# Patient Record
Sex: Female | Born: 1995 | Race: White | Hispanic: Yes | Marital: Single | Smoking: Never smoker
Health system: Southern US, Community
[De-identification: ages and names within clinical notes are randomized; demographics above are authoritative.]

## PROBLEM LIST (undated history)

## (undated) ENCOUNTER — Inpatient Hospital Stay (HOSPITAL_COMMUNITY): Payer: Self-pay

## (undated) DIAGNOSIS — Z789 Other specified health status: Secondary | ICD-10-CM

## (undated) DIAGNOSIS — F419 Anxiety disorder, unspecified: Secondary | ICD-10-CM

## (undated) DIAGNOSIS — F32A Depression, unspecified: Secondary | ICD-10-CM

## (undated) HISTORY — PX: NO PAST SURGERIES: SHX2092

## (undated) HISTORY — DX: Depression, unspecified: F32.A

## (undated) HISTORY — DX: Anxiety disorder, unspecified: F41.9

---

## 2017-07-04 ENCOUNTER — Encounter (HOSPITAL_COMMUNITY): Payer: Self-pay

## 2017-07-04 ENCOUNTER — Emergency Department (HOSPITAL_COMMUNITY)
Admission: EM | Admit: 2017-07-04 | Discharge: 2017-07-04 | Disposition: A | Discharge status: Home / Self Care | Payer: No Typology Code available for payment source | Attending: Emergency Medicine | Admitting: Emergency Medicine

## 2017-07-04 ENCOUNTER — Emergency Department (HOSPITAL_COMMUNITY): Payer: No Typology Code available for payment source

## 2017-07-04 ENCOUNTER — Other Ambulatory Visit: Payer: Self-pay

## 2017-07-04 DIAGNOSIS — S199XXA Unspecified injury of neck, initial encounter: Secondary | ICD-10-CM | POA: Diagnosis present

## 2017-07-04 DIAGNOSIS — Y92410 Unspecified street and highway as the place of occurrence of the external cause: Secondary | ICD-10-CM | POA: Diagnosis not present

## 2017-07-04 DIAGNOSIS — Y999 Unspecified external cause status: Secondary | ICD-10-CM | POA: Insufficient documentation

## 2017-07-04 DIAGNOSIS — S239XXA Sprain of unspecified parts of thorax, initial encounter: Secondary | ICD-10-CM | POA: Insufficient documentation

## 2017-07-04 DIAGNOSIS — S060X1A Concussion with loss of consciousness of 30 minutes or less, initial encounter: Secondary | ICD-10-CM | POA: Diagnosis not present

## 2017-07-04 DIAGNOSIS — Y9389 Activity, other specified: Secondary | ICD-10-CM | POA: Diagnosis not present

## 2017-07-04 DIAGNOSIS — S161XXA Strain of muscle, fascia and tendon at neck level, initial encounter: Secondary | ICD-10-CM

## 2017-07-04 MED ORDER — CYCLOBENZAPRINE HCL 5 MG PO TABS: 5.0000 mg | ORAL_TABLET | Freq: Two times a day (BID) | ORAL | 0 refills | Status: DC | PRN

## 2017-07-04 MED ORDER — DICLOFENAC SODIUM 50 MG PO TBEC: 50.0000 mg | DELAYED_RELEASE_TABLET | Freq: Two times a day (BID) | ORAL | 0 refills | Status: DC
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
## 2017-07-04 NOTE — Discharge Instructions (Signed)
The muscle relaxant will make you sleepy. Do not drive while taking it. Return to the ED if your symptoms worsen.

## 2017-07-18 ENCOUNTER — Ambulatory Visit: Payer: Self-pay | Admitting: Family Medicine

## 2018-04-13 ENCOUNTER — Emergency Department (HOSPITAL_COMMUNITY)
Admission: EM | Admit: 2018-04-13 | Discharge: 2018-04-14 | Disposition: A | Discharge status: Home / Self Care | Payer: Self-pay | Attending: Emergency Medicine | Admitting: Emergency Medicine

## 2018-04-13 ENCOUNTER — Other Ambulatory Visit: Payer: Self-pay

## 2018-04-13 ENCOUNTER — Encounter (HOSPITAL_COMMUNITY): Payer: Self-pay | Admitting: Emergency Medicine

## 2018-04-13 DIAGNOSIS — S01112A Laceration without foreign body of left eyelid and periocular area, initial encounter: Secondary | ICD-10-CM | POA: Insufficient documentation

## 2018-04-13 DIAGNOSIS — Z79899 Other long term (current) drug therapy: Secondary | ICD-10-CM | POA: Insufficient documentation

## 2018-04-13 DIAGNOSIS — Y9389 Activity, other specified: Secondary | ICD-10-CM | POA: Insufficient documentation

## 2018-04-13 DIAGNOSIS — Z23 Encounter for immunization: Secondary | ICD-10-CM | POA: Insufficient documentation

## 2018-04-13 DIAGNOSIS — S0181XA Laceration without foreign body of other part of head, initial encounter: Secondary | ICD-10-CM

## 2018-04-13 DIAGNOSIS — W208XXA Other cause of strike by thrown, projected or falling object, initial encounter: Secondary | ICD-10-CM | POA: Insufficient documentation

## 2018-04-13 DIAGNOSIS — Y999 Unspecified external cause status: Secondary | ICD-10-CM | POA: Insufficient documentation

## 2018-04-13 DIAGNOSIS — Y929 Unspecified place or not applicable: Secondary | ICD-10-CM | POA: Insufficient documentation

## 2018-04-13 NOTE — ED Triage Notes (Signed)
Pt reports a sharp wood corner of a mirror falling on her tonight and slicing her left eyebrow, laceration about an inch. Bleeding controlled. No other injuries noted. Pain 6/10.

## 2018-04-14 MED ORDER — TETANUS-DIPHTH-ACELL PERTUSSIS 5-2.5-18.5 LF-MCG/0.5 IM SUSP
0.5000 mL | Freq: Once | INTRAMUSCULAR | Status: AC
  Administered 2018-04-14: 0.5 mL via INTRAMUSCULAR
  Filled 2018-04-14: qty 0.5

## 2018-04-14 MED ORDER — IBUPROFEN 800 MG PO TABS
800.0000 mg | ORAL_TABLET | Freq: Once | ORAL | Status: AC
  Administered 2018-04-14: 800 mg via ORAL
  Filled 2018-04-14: qty 1

## 2018-04-14 MED ORDER — LIDOCAINE-EPINEPHRINE (PF) 2 %-1:200000 IJ SOLN
10.0000 mL | Freq: Once | INTRAMUSCULAR | Status: AC
  Administered 2018-04-14: 10 mL
  Filled 2018-04-14: qty 20

## 2018-04-14 NOTE — ED Provider Notes (Signed)
[facility] EMERGENCY DEPARTMENT Provider Note   CSN: [id number] Arrival date & time: 04/13/18  2331     History   Chief Complaint Chief Complaint  Patient presents with  . Facial Laceration    HPI Katrina May is a 22 y.o. female.   22 year old female presents to the emergency department for evaluation of a laceration to her lateral left eyebrow.  This was sustained at approximately 2200 tonight.  She was cleaning when a mirror fell towards her.  She was struck with the sharp wooden corner of the mirror sustaining her injury.  No medication taken prior to arrival.  Complains of a mild headache.  No associated LOC, nausea, vomiting.  The history is provided by the patient. No language interpreter was used.  Laceration   The incident occurred 3 to 5 hours ago. The laceration is located on the face. The laceration is 2 cm in size. Injury mechanism: wooden edge of mirror. The pain is mild. The pain has been constant since onset. She reports no foreign bodies present. Her tetanus status is unknown.    History reviewed. No pertinent past medical history.  There are no active problems to display for this patient.   History reviewed. No pertinent surgical history.   OB History   None      Home Medications    Prior to Admission medications   Medication Sig Start Date End Date Taking? Authorizing Provider  cyclobenzaprine (FLEXERIL) 5 MG tablet Take 1 tablet (5 mg total) 2 (two) times daily as needed by mouth for muscle spasms. 07/04/17   Janne Napoleon, NP  diclofenac (VOLTAREN) 50 MG EC tablet Take 1 tablet (50 mg total) 2 (two) times daily by mouth. 07/04/17   Janne Napoleon, NP    Family History No family history on file.  Social History Social History   Tobacco Use  . Smoking status: Never Smoker  . Smokeless tobacco: Never Used  Substance Use Topics  . Alcohol use: Never    Frequency: Never  . Drug use: Never     Allergies   Patient has no  known allergies.   Review of Systems Review of Systems Ten systems reviewed and are negative for acute change, except as noted in the HPI.    Physical Exam Updated Vital Signs BP 117/72 (BP Location: Right Arm)   Pulse 70   Temp 98.1 F (36.7 C) (Oral)   Resp 16   Ht 5' (1.524 m)   Wt 53.1 kg   LMP 04/13/2018   SpO2 99%   BMI 22.85 kg/m   Physical Exam  Constitutional: She is oriented to person, place, and time. She appears well-developed and well-nourished. No distress.  Nontoxic appearing and in NAD  HENT:  Head: Normocephalic.  +laceration to left eyebrow, lateral  Eyes: Conjunctivae and EOM are normal. No scleral icterus.    Neck: Normal range of motion.  No meningismus  Pulmonary/Chest: Effort normal. No respiratory distress.  Respirations even and unlabored  Musculoskeletal: Normal range of motion.  Neurological: She is alert and oriented to person, place, and time. She exhibits normal muscle tone. Coordination normal.  Skin: Skin is warm and dry. No rash noted. She is not diaphoretic. No erythema. No pallor.  Psychiatric: She has a normal mood and affect. Her behavior is normal.  Nursing note and vitals reviewed.    ED Treatments / Results  Labs (all labs ordered are listed, but only abnormal results are displayed) Labs Reviewed -  No data to display  EKG None  Radiology No results found.  Procedures Procedures (including critical care time)   LACERATION REPAIR Performed by: Antony MaduraKelly Khyrin Trevathan Authorized by: Antony MaduraKelly Fares Ramthun Consent: Verbal consent obtained. Risks and benefits: risks, benefits and alternatives were discussed Consent given by: patient Patient identity confirmed: provided demographic data Prepped and Draped in normal sterile fashion Wound explored  Laceration Location: L eyebrow  Laceration Length: 2cm  No Foreign Bodies seen or palpated  Anesthesia: local infiltration  Local anesthetic: lidocaine 1% with epinephrine  Anesthetic  total: 2 ml  Irrigation method: syringe Amount of cleaning: standard  Skin closure: 4-0 chromic  Number of sutures: 3  Technique: simple interrupted  Patient tolerance: Patient tolerated the procedure well with no immediate complications.   Medications Ordered in ED Medications  Tdap (BOOSTRIX) injection 0.5 mL (0.5 mLs Intramuscular Given 04/14/18 0301)  lidocaine-EPINEPHrine (XYLOCAINE W/EPI) 2 %-1:200000 (PF) injection 10 mL (10 mLs Other Given by Other 04/14/18 0302)  ibuprofen (ADVIL,MOTRIN) tablet 800 mg (800 mg Oral Given 04/14/18 0301)     Initial Impression / Assessment and Plan / ED Course  I have reviewed the triage vital signs and the nursing notes.  Pertinent labs & imaging results that were available during my care of the patient were reviewed by me and considered in my medical decision making (see chart for details).     Tdap booster given. Laceration occurred < 8 hours prior to repair which was well tolerated. Pt has no comorbidities to effect normal wound healing. Discussed suture home care with pt and answered questions. Pt to f-u for wound check PRN. Return precautions discussed and provided. Patient discharged in stable condition with no unaddressed concerns.   Final Clinical Impressions(s) / ED Diagnoses   Final diagnoses:  Facial laceration, initial encounter    ED Discharge Orders    None       Antony MaduraHumes, Valisha Heslin, PA-C 04/14/18 [id number]    Shon BatonHorton, Courtney F, MD 04/15/18 [phone]

## 2018-04-14 NOTE — Discharge Instructions (Signed)
Take Tylenol or ibuprofen for pain.  We recommend the use of topical bacitracin.  After 1 week, you may benefit from topical vitamin E oil as this will limit scarring.  Your stitches will dissolve on their own and do not need to be formally removed.  Avoid soaking your head in water, such as while bathing in a tub or swimming, for 1 week.  Return for new or concerning symptoms or if signs of infection develop.

## 2018-04-14 NOTE — ED Notes (Signed)
ED Provider at bedside. 

## 2018-10-28 ENCOUNTER — Other Ambulatory Visit: Payer: Self-pay

## 2018-10-28 ENCOUNTER — Encounter (HOSPITAL_COMMUNITY): Payer: Self-pay | Admitting: *Deleted

## 2018-10-28 ENCOUNTER — Inpatient Hospital Stay (HOSPITAL_COMMUNITY)
Admission: AD | Admit: 2018-10-28 | Discharge: 2018-10-28 | Disposition: A | Discharge status: Home / Self Care | Payer: Medicaid Other | Attending: Obstetrics and Gynecology | Admitting: Obstetrics and Gynecology

## 2018-10-28 ENCOUNTER — Inpatient Hospital Stay (HOSPITAL_COMMUNITY): Payer: Medicaid Other

## 2018-10-28 DIAGNOSIS — R109 Unspecified abdominal pain: Secondary | ICD-10-CM | POA: Insufficient documentation

## 2018-10-28 DIAGNOSIS — O26891 Other specified pregnancy related conditions, first trimester: Secondary | ICD-10-CM | POA: Insufficient documentation

## 2018-10-28 DIAGNOSIS — Z3A01 Less than 8 weeks gestation of pregnancy: Secondary | ICD-10-CM | POA: Diagnosis not present

## 2018-10-28 DIAGNOSIS — O26899 Other specified pregnancy related conditions, unspecified trimester: Secondary | ICD-10-CM

## 2018-10-28 HISTORY — DX: Other specified health status: Z78.9

## 2018-10-28 LAB — URINALYSIS, ROUTINE W REFLEX MICROSCOPIC
BILIRUBIN URINE: NEGATIVE
GLUCOSE, UA: NEGATIVE mg/dL
HGB URINE DIPSTICK: NEGATIVE
Ketones, ur: NEGATIVE mg/dL
LEUKOCYTE UA: NEGATIVE
Nitrite: NEGATIVE
PH: 6 (ref 5.0–8.0)
Protein, ur: NEGATIVE mg/dL
SPECIFIC GRAVITY, URINE: 1.023 (ref 1.005–1.030)

## 2018-10-28 LAB — HCG, QUANTITATIVE, PREGNANCY: HCG, BETA CHAIN, QUANT, S: 25615 m[IU]/mL — AB (ref <5)

## 2018-10-28 LAB — WET PREP, GENITAL
CLUE CELLS WET PREP: NONE SEEN
Trich, Wet Prep: NONE SEEN
WBC WET PREP: NONE SEEN
Yeast Wet Prep HPF POC: NONE SEEN

## 2018-10-28 LAB — CBC
HEMATOCRIT: 37.5 % (ref 36.0–46.0)
HEMOGLOBIN: 12.6 g/dL (ref 12.0–15.0)
MCH: 29.9 pg (ref 26.0–34.0)
MCHC: 33.6 g/dL (ref 30.0–36.0)
MCV: 89.1 fL (ref 80.0–100.0)
Platelets: 224 10*3/uL (ref 150–400)
RBC: 4.21 MIL/uL (ref 3.87–5.11)
RDW: 12.6 % (ref 11.5–15.5)
WBC: 9.9 10*3/uL (ref 4.0–10.5)
nRBC: 0 % (ref 0.0–0.2)

## 2018-10-28 LAB — POCT PREGNANCY, URINE: PREG TEST UR: POSITIVE — AB

## 2018-10-28 NOTE — MAU Provider Note (Signed)
History     CSN: [id number]  Arrival date and time: 10/28/18 2151   First Provider Initiated Contact with Patient 10/28/18 2257      Chief Complaint  Patient presents with  . Abdominal Pain   [id number]  by LMP presenting with LAP. Pain started earlier today. Describes as sharp at first in RLQ then became cramping. Rates 6/10. Has not taken anything for it.  OB History    Gravida  2   Para      Term      Preterm      AB  1   Living  0     SAB  1   TAB      Ectopic      Multiple      Live Births  0           Past Medical History:  Diagnosis Date  . Medical history non-contributory     Past Surgical History:  Procedure Laterality Date  . NO PAST SURGERIES      Family History  Problem Relation Age of Onset  . Diabetes Paternal Grandfather     Social History   Tobacco Use  . Smoking status: Never Smoker  . Smokeless tobacco: Never Used  Substance Use Topics  . Alcohol use: Never    Frequency: Never  . Drug use: Never    Allergies: No Known Allergies  No medications prior to admission.    Review of Systems  Constitutional: Negative for fever.  Gastrointestinal: Positive for abdominal pain and nausea. Negative for constipation, diarrhea and vomiting.  Genitourinary: Negative for dysuria, urgency, vaginal bleeding and vaginal discharge.   Physical Exam   Blood pressure 121/77, pulse 79, temperature 98.4 F (36.9 C), temperature source Oral, resp. rate 18, last menstrual period 09/19/2018, SpO2 100 %.  Physical Exam  Nursing note and vitals reviewed. Constitutional: She is oriented to person, place, and time. She appears well-developed and well-nourished. No distress.  HENT:  Head: Normocephalic and atraumatic.  Neck: Normal range of motion.  Cardiovascular: Normal rate.  Respiratory: Effort normal. No respiratory distress.  GI: Soft. She exhibits no distension and no mass. There is abdominal tenderness in the right upper  quadrant and right lower quadrant. There is no rebound and no guarding.  Genitourinary:    Genitourinary Comments: External: no lesions or erythema Vagina: rugated, pink, moist, scant thin white discharge,no blood Uterus: non enlarged, anteverted, non tender, no CMT Adnexae: no masses, no tenderness left, no tenderness right Cervix closed    Musculoskeletal: Normal range of motion.  Neurological: She is alert and oriented to person, place, and time.  Skin: Skin is warm and dry.   Results for orders placed or performed during the hospital encounter of 10/28/18 (from the past 24 hour(s))  Pregnancy, urine POC     Status: Abnormal   Collection Time: 10/28/18 10:25 PM  Result Value Ref Range   Preg Test, Ur POSITIVE (A) NEGATIVE  Urinalysis, Routine w reflex microscopic     Status: None   Collection Time: 10/28/18 10:29 PM  Result Value Ref Range   Color, Urine YELLOW YELLOW   APPearance CLEAR CLEAR   Specific Gravity, Urine 1.023 1.005 - 1.030   pH 6.0 5.0 - 8.0   Glucose, UA NEGATIVE NEGATIVE mg/dL   Hgb urine dipstick NEGATIVE NEGATIVE   Bilirubin Urine NEGATIVE NEGATIVE   Ketones, ur NEGATIVE NEGATIVE mg/dL   Protein, ur NEGATIVE NEGATIVE mg/dL   Nitrite NEGATIVE NEGATIVE  Leukocytes,Ua NEGATIVE NEGATIVE  CBC     Status: None   Collection Time: 10/28/18 10:46 PM  Result Value Ref Range   WBC 9.9 4.0 - 10.5 K/uL   RBC 4.21 3.87 - 5.11 MIL/uL   Hemoglobin 12.6 12.0 - 15.0 g/dL   HCT 43.2 76.1 - 47.0 %   MCV 89.1 80.0 - 100.0 fL   MCH 29.9 26.0 - 34.0 pg   MCHC 33.6 30.0 - 36.0 g/dL   RDW 92.9 57.4 - 73.4 %   Platelets 224 150 - 400 K/uL   nRBC 0.0 0.0 - 0.2 %  Wet prep, genital     Status: None   Collection Time: 10/28/18 11:08 PM  Result Value Ref Range   Yeast Wet Prep HPF POC NONE SEEN NONE SEEN   Trich, Wet Prep NONE SEEN NONE SEEN   Clue Cells Wet Prep HPF POC NONE SEEN NONE SEEN   WBC, Wet Prep HPF POC NONE SEEN NONE SEEN   Sperm MANY BACTERIA SEEN    US Ob  Less Than 14 Weeks With Ob Transvaginal  Result Date: 10/28/2018 CLINICAL DATA:  Acute right abdominal pain. Estimated gestational age by LMP is 5 weeks 4 days. Positive urine pregnancy test. Quantitative beta HCG is pending. EXAM: OBSTETRIC <14 WK [place] AND TRANSVAGINAL OB US TECHNIQUE: Both transabdominal and transvaginal ultrasound examinations were performed for complete evaluation of the gestation as well as the maternal uterus, adnexal regions, and pelvic cul-de-sac. Transvaginal technique was performed to assess early pregnancy. COMPARISON:  None. FINDINGS: Intrauterine gestational sac: A single intrauterine gestational sac is identified. Yolk sac:  Yolk sac is visualized. Embryo:  Fetal pole is not identified. Cardiac Activity: Not identified. MSD: 10 mm   5 w   5 d Subchorionic hemorrhage: Minimal subchorionic hemorrhage is present. Maternal uterus/adnexae: Uterus is anteverted. No myometrial mass lesions identified. Both ovaries are visualized and appear normal. No abnormal adnexal masses. No abnormal pelvic fluid collections. IMPRESSION: Probable early intrauterine gestational sac with a yolk sac, but no fetal pole or cardiac activity yet visualized. Recommend follow-up quantitative B-HCG levels and follow-up US in 14 days to assess viability. This recommendation follows SRU consensus guidelines: Diagnostic Criteria for Nonviable Pregnancy Early in the First Trimester. Malva Limes Med 2013; 037:0964-38. Electronically Signed   By: Burman Nieves M.D.   On: 10/28/2018 23:41   MAU Course  Procedures Orders Placed This Encounter  Procedures  . Wet prep, genital    Standing Status:   Standing    Number of Occurrences:   1    Order Specific Question:   Patient immune status    Answer:   Normal  . US OB LESS THAN 14 WEEKS WITH OB TRANSVAGINAL    Standing Status:   Standing    Number of Occurrences:   1    Order Specific Question:   Symptom/Reason for Exam    Answer:   Abdominal pain affecting  pregnancy [[id number]]  . Urinalysis, Routine w reflex microscopic    Standing Status:   Standing    Number of Occurrences:   1  . CBC    Standing Status:   Standing    Number of Occurrences:   1  . hCG, quantitative, pregnancy    Standing Status:   Standing    Number of Occurrences:   1  . Pregnancy, urine POC    Standing Status:   Standing    Number of Occurrences:   1  . Discharge patient  Order Specific Question:   Discharge disposition    Answer:   01-Home or Self Care [1]    Order Specific Question:   Discharge patient date    Answer:   10/28/2018   MDM Labs and [place] ordered and reviewed. Early IUP seen on [place]. Unclear etiology of pain, possibly MSK. Stable for discharge home.   Assessment and Plan   1. [redacted] weeks gestation of pregnancy   2. Abdominal pain affecting pregnancy    Discharge home Follow up at [facility] to start care SAB precautions Pregnancy verification letter provided Safe meds in pregnancy provided  Allergies as of 10/28/2018   No Known Allergies     Medication List    STOP taking these medications   cyclobenzaprine 5 MG tablet Commonly known as:  FLEXERIL   diclofenac 50 MG EC tablet Commonly known as:  VOLTAREN     TAKE these medications   prenatal multivitamin Tabs tablet Take 1 tablet by mouth daily at 12 noon.      Donette Larry, CNM 10/28/2018, 11:49 PM

## 2018-10-28 NOTE — Discharge Instructions (Signed)
Abdominal Pain During Pregnancy ° °Abdominal pain is common during pregnancy, and has many possible causes. Some causes are more serious than others, and sometimes the cause is not known. Abdominal pain can be a sign that labor is starting. It can also be caused by normal growth and stretching of muscles and ligaments during pregnancy. Always tell your health care provider if you have any abdominal pain. °Follow these instructions at home: °· Do not have sex or put anything in your vagina until your pain goes away completely. °· Get plenty of rest until your pain improves. °· Drink enough fluid to keep your urine pale yellow. °· Take over-the-counter and prescription medicines only as told by your health care provider. °· Keep all follow-up visits as told by your health care provider. This is important. °Contact a health care provider if: °· Your pain continues or gets worse after resting. °· You have lower abdominal pain that: °? Comes and goes at regular intervals. °? Spreads to your back. °? Is similar to menstrual cramps. °· You have pain or burning when you urinate. °Get help right away if: °· You have a fever or chills. °· You have vaginal bleeding. °· You are leaking fluid from your vagina. °· You are passing tissue from your vagina. °· You have vomiting or diarrhea that lasts for more than 24 hours. °· Your baby is moving less than usual. °· You feel very weak or faint. °· You have shortness of breath. °· You develop severe pain in your upper abdomen. °Summary °· Abdominal pain is common during pregnancy, and has many possible causes. °· If you experience abdominal pain during pregnancy, tell your health care provider right away. °· Follow your health care provider's home care instructions and keep all follow-up visits as directed. °This information is not intended to replace advice given to you by your health care provider. Make sure you discuss any questions you have with your health care  provider. °Document Released: 08/05/2005 Document Revised: 11/07/2016 Document Reviewed: 11/07/2016 °[employer] Interactive Patient Education © 2019 [employer] ° °Safe Medications in Pregnancy  ° °Acne: °Benzoyl Peroxide °Salicylic Acid ° °Backache/Headache: °Tylenol: 2 regular strength every 4 hours OR °             2 Extra strength every 6 hours ° °Colds/Coughs/Allergies: °Benadryl (alcohol free) 25 mg every 6 hours as needed °Breath right strips °Claritin °Cepacol throat lozenges °Chloraseptic throat spray °Cold-Eeze- up to three times per day °Cough drops, alcohol free °Flonase (by prescription only) °Guaifenesin °Mucinex °Robitussin DM (plain only, alcohol free) °Saline nasal spray/drops °Sudafed (pseudoephedrine) & Actifed ** use only after [redacted] weeks gestation and if you do not have high blood pressure °Tylenol °Vicks Vaporub °Zinc lozenges °Zyrtec  ° °Constipation: °Colace °Ducolax suppositories °Fleet enema °Glycerin suppositories °Metamucil °Milk of magnesia °Miralax °Senokot °Smooth move tea ° °Diarrhea: °Kaopectate °Imodium A-D ° °*NO pepto Bismol ° °Hemorrhoids: °Anusol °Anusol HC °Preparation H °Tucks ° °Indigestion: °Tums °Maalox °Mylanta °Zantac  °Pepcid ° °Insomnia: °Benadryl (alcohol free) 25mg every 6 hours as needed °Tylenol PM °Unisom, no Gelcaps ° °Leg Cramps: °Tums °MagGel ° °Nausea/Vomiting:  °Bonine °Dramamine °Emetrol °Ginger extract °Sea bands °Meclizine  °Nausea medication to take during pregnancy:  °Unisom (doxylamine succinate 25 mg tablets) Take one tablet daily at bedtime. If symptoms are not adequately controlled, the dose can be increased to a maximum recommended dose of two tablets daily (1/2 tablet in the morning, 1/2 tablet mid-afternoon and one at bedtime). °Vitamin B6 100mg   two tablets daily (1/2 tablet in the morning, 1/2 tablet mid-afternoon and one at bedtime). Vitamin B6 100mg  tablets. Take one tablet twice a day (up to 200 mg per day).  Skin Rashes: Aveeno products Benadryl cream or 25mg  every 6 hours as needed Calamine Lotion 1% cortisone cream  Yeast  infection: Gyne-lotrimin 7 Monistat 7   **If taking multiple medications, please check labels to avoid duplicating the same active ingredients **take medication as directed on the label ** Do not exceed 4000 mg of tylenol in 24 hours **Do not take medications that contain aspirin or ibuprofen

## 2018-10-28 NOTE — MAU Note (Signed)
Pt presents to MAU c/o lower right sided abdominal pain that started today at work. Pt states she was in her chair and when she went to turn she had a sharp sudden pain that exceeded a 10/10 pain score. Pt then states the pain has continued since then and it is a 6/10 now its kind of a crampy feeling. Pt denies LOF, vaginal discharge, and bleeding. Pt does report a RUQ aching pain under her right breast. No medical hx noted. Pt reports nausea.

## 2018-10-29 LAB — GC/CHLAMYDIA PROBE AMP (~~LOC~~) NOT AT ARMC
Chlamydia: NEGATIVE
Neisseria Gonorrhea: NEGATIVE

## 2018-11-16 ENCOUNTER — Telehealth: Payer: Self-pay | Admitting: *Deleted

## 2018-11-16 DIAGNOSIS — O210 Mild hyperemesis gravidarum: Secondary | ICD-10-CM

## 2018-11-16 MED ORDER — ONDANSETRON HCL 4 MG PO TABS: 4.0000 mg | ORAL_TABLET | Freq: Four times a day (QID) | ORAL | 1 refills | Status: DC | PRN

## 2018-11-16 NOTE — Telephone Encounter (Signed)
Elizebeth called and left a message this am that she has an appt 12/08/18 but she can't keep anything down and is dizzy, dehydrated.  I called [facility] and she reports she can keep some fluids down and doesn't throw up everything ;but a lot. States when it is hot she feels like she is dehydrated. Informed her I had discussed with provider and we will send rx for zofran to her pharmacy. Advised her if she isn't keeping hardly anything down and feels dizzy, lightheaded to go to MAU for evaluation. Advised to sip fluids , not drink a lot at once. She voices understanding.

## 2018-12-08 ENCOUNTER — Ambulatory Visit (INDEPENDENT_AMBULATORY_CARE_PROVIDER_SITE_OTHER): Payer: Medicaid Other | Admitting: *Deleted

## 2018-12-08 ENCOUNTER — Other Ambulatory Visit: Payer: Self-pay

## 2018-12-08 DIAGNOSIS — Z3490 Encounter for supervision of normal pregnancy, unspecified, unspecified trimester: Secondary | ICD-10-CM | POA: Insufficient documentation

## 2018-12-08 HISTORY — DX: Encounter for supervision of normal pregnancy, unspecified, unspecified trimester: Z34.90

## 2018-12-08 MED ORDER — PROMETHAZINE HCL 25 MG PO TABS: 25.0000 mg | ORAL_TABLET | Freq: Four times a day (QID) | ORAL | 1 refills | Status: DC | PRN

## 2018-12-08 NOTE — Progress Notes (Signed)
I connected with  Katrina May on 12/08/18 at  3:15 PM EDT by telephone and verified that I am speaking with the correct person using two identifiers.   I discussed the limitations, risks, security and privacy concerns of performing an evaluation and management service by telephone and the availability of in person appointments. I also discussed with the patient that there may be a patient responsible charge related to this service. The patient expressed understanding and agreed to proceed.  New Ob intake interview completed. Pt reports she had [place] @ [facility] on 4/8 which gave EDD 06/30/19. This is within 4 days of EDD by sure LMP. Pt advised EDD is 06/26/19 by LMP. Pt stated she is having frequent nausea and vomiting - mostly bile, not food. She has been taking ondansetron which does not work and requests alternate medication. Rx for phenergan e-prescribed to pharmacy. Pt also advised of dietary recommendations to decrease N&V. Pt advised that her prenatal care will include a combination of face to face visits as well as telephone/virtual visits. Pt agrees to complete [employer] registration and will enter weekly BP into the app once she has received BP cuff. She will have initial office visit on 5/7 w/provider and lab draw. Pt voiced understanding of all information and instructions given.   Ora Mcnatt, Drucilla Schmidt, RN 12/08/2018  3:09 PM

## 2018-12-09 ENCOUNTER — Encounter: Payer: Self-pay | Admitting: *Deleted

## 2018-12-24 ENCOUNTER — Encounter: Payer: Self-pay | Admitting: Obstetrics and Gynecology

## 2018-12-24 ENCOUNTER — Telehealth: Payer: Self-pay | Admitting: Obstetrics and Gynecology

## 2018-12-24 ENCOUNTER — Other Ambulatory Visit: Payer: Self-pay

## 2018-12-24 ENCOUNTER — Ambulatory Visit (INDEPENDENT_AMBULATORY_CARE_PROVIDER_SITE_OTHER): Payer: Medicaid Other | Admitting: Obstetrics and Gynecology

## 2018-12-24 VITALS — BP 111/59 | HR 70 | Wt 115.0 lb

## 2018-12-24 DIAGNOSIS — Z23 Encounter for immunization: Secondary | ICD-10-CM | POA: Diagnosis not present

## 2018-12-24 DIAGNOSIS — Z3492 Encounter for supervision of normal pregnancy, unspecified, second trimester: Secondary | ICD-10-CM | POA: Diagnosis not present

## 2018-12-24 NOTE — Patient Instructions (Signed)
° °Second Trimester of Pregnancy °The second trimester is from week 14 through week 27 (months 4 through 6). The second trimester is often a time when you feel your best. Your body has adjusted to being pregnant, and you begin to feel better physically. Usually, morning sickness has lessened or quit completely, you may have more energy, and you may have an increase in appetite. The second trimester is also a time when the fetus is growing rapidly. At the end of the sixth month, the fetus is about 9 inches long and weighs about 1½ pounds. You will likely begin to feel the baby move (quickening) between 16 and 20 weeks of pregnancy. °Body changes during your second trimester °Your body continues to go through many changes during your second trimester. The changes vary from woman to woman. °· Your weight will continue to increase. You will notice your lower abdomen bulging out. °· You may begin to get stretch marks on your hips, abdomen, and breasts. °· You may develop headaches that can be relieved by medicines. The medicines should be approved by your health care provider. °· You may urinate more often because the fetus is pressing on your bladder. °· You may develop or continue to have heartburn as a result of your pregnancy. °· You may develop constipation because certain hormones are causing the muscles that push waste through your intestines to slow down. °· You may develop hemorrhoids or swollen, bulging veins (varicose veins). °· You may have back pain. This is caused by: °? Weight gain. °? Pregnancy hormones that are relaxing the joints in your pelvis. °? A shift in weight and the muscles that support your balance. °· Your breasts will continue to grow and they will continue to become tender. °· Your gums may bleed and may be sensitive to brushing and flossing. °· Dark spots or blotches (chloasma, mask of pregnancy) may develop on your face. This will likely fade after the baby is born. °· A dark line from  your belly button to the pubic area (linea nigra) may appear. This will likely fade after the baby is born. °· You may have changes in your hair. These can include thickening of your hair, rapid growth, and changes in texture. Some women also have hair loss during or after pregnancy, or hair that feels dry or thin. Your hair will most likely return to normal after your baby is born. °What to expect at prenatal visits °During a routine prenatal visit: °· You will be weighed to make sure you and the fetus are growing normally. °· Your blood pressure will be taken. °· Your abdomen will be measured to track your baby's growth. °· The fetal heartbeat will be listened to. °· Any test results from the previous visit will be discussed. °Your health care provider may ask you: °· How you are feeling. °· If you are feeling the baby move. °· If you have had any abnormal symptoms, such as leaking fluid, bleeding, severe headaches, or abdominal cramping. °· If you are using any tobacco products, including cigarettes, chewing tobacco, and electronic cigarettes. °· If you have any questions. °Other tests that may be performed during your second trimester include: °· Blood tests that check for: °? Low iron levels (anemia). °? High blood sugar that affects pregnant women (gestational diabetes) between 24 and 28 weeks. °? Rh antibodies. This is to check for a protein on red blood cells (Rh factor). °· Urine tests to check for infections, diabetes, or protein in   the urine. °· An ultrasound to confirm the proper growth and development of the baby. °· An amniocentesis to check for possible genetic problems. °· Fetal screens for spina bifida and Down syndrome. °· HIV (human immunodeficiency virus) testing. Routine prenatal testing includes screening for HIV, unless you choose not to have this test. °Follow these instructions at home: °Medicines °· Follow your health care provider's instructions regarding medicine use. Specific medicines  may be either safe or unsafe to take during pregnancy. °· Take a prenatal vitamin that contains at least 600 micrograms (mcg) of folic acid. °· If you develop constipation, try taking a stool softener if your health care provider approves. °Eating and drinking ° °· Eat a balanced diet that includes fresh fruits and vegetables, whole grains, good sources of protein such as meat, eggs, or tofu, and low-fat dairy. Your health care provider will help you determine the amount of weight gain that is right for you. °· Avoid raw meat and uncooked cheese. These carry germs that can cause birth defects in the baby. °· If you have low calcium intake from food, talk to your health care provider about whether you should take a daily calcium supplement. °· Limit foods that are high in fat and processed sugars, such as fried and sweet foods. °· To prevent constipation: °? Drink enough fluid to keep your urine clear or pale yellow. °? Eat foods that are high in fiber, such as fresh fruits and vegetables, whole grains, and beans. °Activity °· Exercise only as directed by your health care provider. Most women can continue their usual exercise routine during pregnancy. Try to exercise for 30 minutes at least 5 days a week. Stop exercising if you experience uterine contractions. °· Avoid heavy lifting, wear low heel shoes, and practice good posture. °· A sexual relationship may be continued unless your health care provider directs you otherwise. °Relieving pain and discomfort °· Wear a good support bra to prevent discomfort from breast tenderness. °· Take warm sitz baths to soothe any pain or discomfort caused by hemorrhoids. Use hemorrhoid cream if your health care provider approves. °· Rest with your legs elevated if you have leg cramps or low back pain. °· If you develop varicose veins, wear support hose. Elevate your feet for 15 minutes, 3-4 times a day. Limit salt in your diet. °Prenatal Care °· Write down your questions. Take  them to your prenatal visits. °· Keep all your prenatal visits as told by your health care provider. This is important. °Safety °· Wear your seat belt at all times when driving. °· Make a list of emergency phone numbers, including numbers for family, friends, the hospital, and police and fire departments. °General instructions °· Ask your health care provider for a referral to a local prenatal education class. Begin classes no later than the beginning of month 6 of your pregnancy. °· Ask for help if you have counseling or nutritional needs during pregnancy. Your health care provider can offer advice or refer you to specialists for help with various needs. °· Do not use hot tubs, steam rooms, or saunas. °· Do not douche or use tampons or scented sanitary pads. °· Do not cross your legs for long periods of time. °· Avoid cat litter boxes and soil used by cats. These carry germs that can cause birth defects in the baby and possibly loss of the fetus by miscarriage or stillbirth. °· Avoid all smoking, herbs, alcohol, and unprescribed drugs. Chemicals in these products can affect the   formation and growth of the baby. °· Do not use any products that contain nicotine or tobacco, such as cigarettes and e-cigarettes. If you need help quitting, ask your health care provider. °· Visit your dentist if you have not gone yet during your pregnancy. Use a soft toothbrush to brush your teeth and be gentle when you floss. °Contact a health care provider if: °· You have dizziness. °· You have mild pelvic cramps, pelvic pressure, or nagging pain in the abdominal area. °· You have persistent nausea, vomiting, or diarrhea. °· You have a bad smelling vaginal discharge. °· You have pain when you urinate. °Get help right away if: °· You have a fever. °· You are leaking fluid from your vagina. °· You have spotting or bleeding from your vagina. °· You have severe abdominal cramping or pain. °· You have rapid weight gain or weight loss. °· You  have shortness of breath with chest pain. °· You notice sudden or extreme swelling of your face, hands, ankles, feet, or legs. °· You have not felt your baby move in over an hour. °· You have severe headaches that do not go away when you take medicine. °· You have vision changes. °Summary °· The second trimester is from week 14 through week 27 (months 4 through 6). It is also a time when the fetus is growing rapidly. °· Your body goes through many changes during pregnancy. The changes vary from woman to woman. °· Avoid all smoking, herbs, alcohol, and unprescribed drugs. These chemicals affect the formation and growth your baby. °· Do not use any tobacco products, such as cigarettes, chewing tobacco, and e-cigarettes. If you need help quitting, ask your health care provider. °· Contact your health care provider if you have any questions. Keep all prenatal visits as told by your health care provider. This is important. °This information is not intended to replace advice given to you by your health care provider. Make sure you discuss any questions you have with your health care provider. °Document Released: 07/30/2001 Document Revised: 09/10/2016 Document Reviewed: 09/10/2016 °[employer] Interactive Patient Education © 2019 [employer] ° ° °Contraception Choices °Contraception, also called birth control, refers to methods or devices that prevent pregnancy. °Hormonal methods °Contraceptive implant ° °A contraceptive implant is a thin, plastic tube that contains a hormone. It is inserted into the upper part of the arm. It can remain in place for up to 3 years. °Progestin-only injections °Progestin-only injections are injections of progestin, a synthetic form of the hormone progesterone. They are given every 3 months by a health care provider. °Birth control pills ° °Birth control pills are pills that contain hormones that prevent pregnancy. They must be taken once a day, preferably at the same time each day. °Birth  control patch ° °The birth control patch contains hormones that prevent pregnancy. It is placed on the skin and must be changed once a week for three weeks and removed on the fourth week. A prescription is needed to use this method of contraception. °Vaginal ring ° °A vaginal ring contains hormones that prevent pregnancy. It is placed in the vagina for three weeks and removed on the fourth week. After that, the process is repeated with a new ring. A prescription is needed to use this method of contraception. °Emergency contraceptive °Emergency contraceptives prevent pregnancy after unprotected sex. They come in pill form and can be taken up to 5 days after sex. They work best the sooner they are taken after having sex. Most   emergency contraceptives are available without a prescription. This method should not be used as your only form of birth control. °Barrier methods °Female condom ° °A female condom is a thin sheath that is worn over the penis during sex. Condoms keep sperm from going inside a woman's body. They can be used with a spermicide to increase their effectiveness. They should be disposed after a single use. °Female condom ° °A female condom is a soft, loose-fitting sheath that is put into the vagina before sex. The condom keeps sperm from going inside a woman's body. They should be disposed after a single use. °Diaphragm ° °A diaphragm is a soft, dome-shaped barrier. It is inserted into the vagina before sex, along with a spermicide. The diaphragm blocks sperm from entering the uterus, and the spermicide kills sperm. A diaphragm should be left in the vagina for 6-8 hours after sex and removed within 24 hours. °A diaphragm is prescribed and fitted by a health care provider. A diaphragm should be replaced every 1-2 years, after giving birth, after gaining more than 15 lb (6.8 kg), and after pelvic surgery. °Cervical cap ° °A cervical cap is a round, soft latex or plastic cup that fits over the cervix. It is  inserted into the vagina before sex, along with spermicide. It blocks sperm from entering the uterus. The cap should be left in place for 6-8 hours after sex and removed within 48 hours. A cervical cap must be prescribed and fitted by a health care provider. It should be replaced every 2 years. °Sponge ° °A sponge is a soft, circular piece of polyurethane foam with spermicide on it. The sponge helps block sperm from entering the uterus, and the spermicide kills sperm. To use it, you make it wet and then insert it into the vagina. It should be inserted before sex, left in for at least 6 hours after sex, and removed and thrown away within 30 hours. °Spermicides °Spermicides are chemicals that kill or block sperm from entering the cervix and uterus. They can come as a cream, jelly, suppository, foam, or tablet. A spermicide should be inserted into the vagina with an applicator at least 10-15 minutes before sex to allow time for it to work. The process must be repeated every time you have sex. Spermicides do not require a prescription. °Intrauterine contraception °Intrauterine device (IUD) °An IUD is a T-shaped device that is put in a woman's uterus. There are two types: °· Hormone IUD.This type contains progestin, a synthetic form of the hormone progesterone. This type can stay in place for 3-5 years. °· Copper IUD.This type is wrapped in copper wire. It can stay in place for 10 years. ° °Permanent methods of contraception °Female tubal ligation °In this method, a woman's fallopian tubes are sealed, tied, or blocked during surgery to prevent eggs from traveling to the uterus. °Hysteroscopic sterilization °In this method, a small, flexible insert is placed into each fallopian tube. The inserts cause scar tissue to form in the fallopian tubes and block them, so sperm cannot reach an egg. The procedure takes about 3 months to be effective. Another form of birth control must be used during those 3 months. °Female  sterilization °This is a procedure to tie off the tubes that carry sperm (vasectomy). After the procedure, the man can still ejaculate fluid (semen). °Natural planning methods °Natural family planning °In this method, a couple does not have sex on days when the woman could become pregnant. °Calendar method °This means keeping   track of the length of each menstrual cycle, identifying the days when pregnancy can happen, and not having sex on those days. °Ovulation method °In this method, a couple avoids sex during ovulation. °Symptothermal method °This method involves not having sex during ovulation. The woman typically checks for ovulation by watching changes in her temperature and in the consistency of cervical mucus. °Post-ovulation method °In this method, a couple waits to have sex until after ovulation. °Summary °· Contraception, also called birth control, means methods or devices that prevent pregnancy. °· Hormonal methods of contraception include implants, injections, pills, patches, vaginal rings, and emergency contraceptives. °· Barrier methods of contraception can include female condoms, female condoms, diaphragms, cervical caps, sponges, and spermicides. °· There are two types of IUDs (intrauterine devices). An IUD can be put in a woman's uterus to prevent pregnancy for 3-5 years. °· Permanent sterilization can be done through a procedure for males, females, or both. °· Natural family planning methods involve not having sex on days when the woman could become pregnant. °This information is not intended to replace advice given to you by your health care provider. Make sure you discuss any questions you have with your health care provider. °Document Released: 08/05/2005 Document Revised: 08/07/2017 Document Reviewed: 09/07/2016 °[employer] Interactive Patient Education © 2019 [employer] ° ° °Breastfeeding ° °Choosing to breastfeed is one of the best decisions you can make for yourself and your baby. A change in  hormones during pregnancy causes your breasts to make breast milk in your milk-producing glands. Hormones prevent breast milk from being released before your baby is born. They also prompt milk flow after birth. Once breastfeeding has begun, thoughts of your baby, as well as his or her sucking or crying, can stimulate the release of milk from your milk-producing glands. °Benefits of breastfeeding °Research shows that breastfeeding offers many health benefits for infants and mothers. It also offers a cost-free and convenient way to feed your baby. °For your baby °· Your first milk (colostrum) helps your baby's digestive system to function better. °· Special cells in your milk (antibodies) help your baby to fight off infections. °· Breastfed babies are less likely to develop asthma, allergies, obesity, or type 2 diabetes. They are also at lower risk for sudden infant death syndrome (SIDS). °· Nutrients in breast milk are better able to meet your baby’s needs compared to infant formula. °· Breast milk improves your baby's brain development. °For you °· Breastfeeding helps to create a very special bond between you and your baby. °· Breastfeeding is convenient. Breast milk costs nothing and is always available at the correct temperature. °· Breastfeeding helps to burn calories. It helps you to lose the weight that you gained during pregnancy. °· Breastfeeding makes your uterus return faster to its size before pregnancy. It also slows bleeding (lochia) after you give birth. °· Breastfeeding helps to lower your risk of developing type 2 diabetes, osteoporosis, rheumatoid arthritis, cardiovascular disease, and breast, ovarian, uterine, and endometrial cancer later in life. °Breastfeeding basics °Starting breastfeeding °· Find a comfortable place to sit or lie down, with your neck and back well-supported. °· Place a pillow or a rolled-up blanket under your baby to bring him or her to the level of your breast (if you are  seated). Nursing pillows are specially designed to help support your arms and your baby while you breastfeed. °· Make sure that your baby's tummy (abdomen) is facing your abdomen. °· Gently massage your breast. With your fingertips, massage from   the outer edges of your breast inward toward the nipple. This encourages milk flow. If your milk flows slowly, you may need to continue this action during the feeding. °· Support your breast with 4 fingers underneath and your thumb above your nipple (make the letter "C" with your hand). Make sure your fingers are well away from your nipple and your baby’s mouth. °· Stroke your baby's lips gently with your finger or nipple. °· When your baby's mouth is open wide enough, quickly bring your baby to your breast, placing your entire nipple and as much of the areola as possible into your baby's mouth. The areola is the colored area around your nipple. °? More areola should be visible above your baby's upper lip than below the lower lip. °? Your baby's lips should be opened and extended outward (flanged) to ensure an adequate, comfortable latch. °? Your baby's tongue should be between his or her lower gum and your breast. °· Make sure that your baby's mouth is correctly positioned around your nipple (latched). Your baby's lips should create a seal on your breast and be turned out (everted). °· It is common for your baby to suck about 2-3 minutes in order to start the flow of breast milk. °Latching °Teaching your baby how to latch onto your breast properly is very important. An improper latch can cause nipple pain, decreased milk supply, and poor weight gain in your baby. Also, if your baby is not latched onto your nipple properly, he or she may swallow some air during feeding. This can make your baby fussy. Burping your baby when you switch breasts during the feeding can help to get rid of the air. However, teaching your baby to latch on properly is still the best way to prevent  fussiness from swallowing air while breastfeeding. °Signs that your baby has successfully latched onto your nipple °· Silent tugging or silent sucking, without causing you pain. Infant's lips should be extended outward (flanged). °· Swallowing heard between every 3-4 sucks once your milk has started to flow (after your let-down milk reflex occurs). °· Muscle movement above and in front of his or her ears while sucking. °Signs that your baby has not successfully latched onto your nipple °· Sucking sounds or smacking sounds from your baby while breastfeeding. °· Nipple pain. °If you think your baby has not latched on correctly, slip your finger into the corner of your baby’s mouth to break the suction and place it between your baby's gums. Attempt to start breastfeeding again. °Signs of successful breastfeeding °Signs from your baby °· Your baby will gradually decrease the number of sucks or will completely stop sucking. °· Your baby will fall asleep. °· Your baby's body will relax. °· Your baby will retain a small amount of milk in his or her mouth. °· Your baby will let go of your breast by himself or herself. °Signs from you °· Breasts that have increased in firmness, weight, and size 1-3 hours after feeding. °· Breasts that are softer immediately after breastfeeding. °· Increased milk volume, as well as a change in milk consistency and color by the fifth day of breastfeeding. °· Nipples that are not sore, cracked, or bleeding. °Signs that your baby is getting enough milk °· Wetting at least 1-2 diapers during the first 24 hours after birth. °· Wetting at least 5-6 diapers every 24 hours for the first week after birth. The urine should be clear or pale yellow by the age of 5 days. °·   Wetting 6-8 diapers every 24 hours as your baby continues to grow and develop. °· At least 3 stools in a 24-hour period by the age of 5 days. The stool should be soft and yellow. °· At least 3 stools in a 24-hour period by the age of 7  days. The stool should be seedy and yellow. °· No loss of weight greater than 10% of birth weight during the first 3 days of life. °· Average weight gain of 4-7 oz (113-198 g) per week after the age of 4 days. °· Consistent daily weight gain by the age of 5 days, without weight loss after the age of 2 weeks. °After a feeding, your baby may spit up a small amount of milk. This is normal. °Breastfeeding frequency and duration °Frequent feeding will help you make more milk and can prevent sore nipples and extremely full breasts (breast engorgement). Breastfeed when you feel the need to reduce the fullness of your breasts or when your baby shows signs of hunger. This is called "breastfeeding on demand." Signs that your baby is hungry include: °· Increased alertness, activity, or restlessness. °· Movement of the head from side to side. °· Opening of the mouth when the corner of the mouth or cheek is stroked (rooting). °· Increased sucking sounds, smacking lips, cooing, sighing, or squeaking. °· Hand-to-mouth movements and sucking on fingers or hands. °· Fussing or crying. °Avoid introducing a pacifier to your baby in the first 4-6 weeks after your baby is born. After this time, you may choose to use a pacifier. Research has shown that pacifier use during the first year of a baby's life decreases the risk of sudden infant death syndrome (SIDS). °Allow your baby to feed on each breast as long as he or she wants. When your baby unlatches or falls asleep while feeding from the first breast, offer the second breast. Because newborns are often sleepy in the first few weeks of life, you may need to awaken your baby to get him or her to feed. °Breastfeeding times will vary from baby to baby. However, the following rules can serve as a guide to help you make sure that your baby is properly fed: °· Newborns (babies 4 weeks of age or younger) may breastfeed every 1-3 hours. °· Newborns should not go without breastfeeding for longer  than 3 hours during the day or 5 hours during the night. °· You should breastfeed your baby a minimum of 8 times in a 24-hour period. °Breast milk pumping ° °  ° °Pumping and storing breast milk allows you to make sure that your baby is exclusively fed your breast milk, even at times when you are unable to breastfeed. This is especially important if you go back to work while you are still breastfeeding, or if you are not able to be present during feedings. Your lactation consultant can help you find a method of pumping that works best for you and give you guidelines about how long it is safe to store breast milk. °Caring for your breasts while you breastfeed °Nipples can become dry, cracked, and sore while breastfeeding. The following recommendations can help keep your breasts moisturized and healthy: °· Avoid using soap on your nipples. °· Wear a supportive bra designed especially for nursing. Avoid wearing underwire-style bras or extremely tight bras (sports bras). °· Air-dry your nipples for 3-4 minutes after each feeding. °· Use only cotton bra pads to absorb leaked breast milk. Leaking of breast milk between feedings is   normal. °· Use lanolin on your nipples after breastfeeding. Lanolin helps to maintain your skin's normal moisture barrier. Pure lanolin is not harmful (not toxic) to your baby. You may also hand express a few drops of breast milk and gently massage that milk into your nipples and allow the milk to air-dry. °In the first few weeks after giving birth, some women experience breast engorgement. Engorgement can make your breasts feel heavy, warm, and tender to the touch. Engorgement peaks within 3-5 days after you give birth. The following recommendations can help to ease engorgement: °· Completely empty your breasts while breastfeeding or pumping. You may want to start by applying warm, moist heat (in the shower or with warm, water-soaked hand towels) just before feeding or pumping. This increases  circulation and helps the milk flow. If your baby does not completely empty your breasts while breastfeeding, pump any extra milk after he or she is finished. °· Apply ice packs to your breasts immediately after breastfeeding or pumping, unless this is too uncomfortable for you. To do this: °? Put ice in a plastic bag. °? Place a towel between your skin and the bag. °? Leave the ice on for 20 minutes, 2-3 times a day. °· Make sure that your baby is latched on and positioned properly while breastfeeding. °If engorgement persists after 48 hours of following these recommendations, contact your health care provider or a lactation consultant. °Overall health care recommendations while breastfeeding °· Eat 3 healthy meals and 3 snacks every day. Well-nourished mothers who are breastfeeding need an additional 450-500 calories a day. You can meet this requirement by increasing the amount of a balanced diet that you eat. °· Drink enough water to keep your urine pale yellow or clear. °· Rest often, relax, and continue to take your prenatal vitamins to prevent fatigue, stress, and low vitamin and mineral levels in your body (nutrient deficiencies). °· Do not use any products that contain nicotine or tobacco, such as cigarettes and e-cigarettes. Your baby may be harmed by chemicals from cigarettes that pass into breast milk and exposure to secondhand smoke. If you need help quitting, ask your health care provider. °· Avoid alcohol. °· Do not use illegal drugs or marijuana. °· Talk with your health care provider before taking any medicines. These include over-the-counter and prescription medicines as well as vitamins and herbal supplements. Some medicines that may be harmful to your baby can pass through breast milk. °· It is possible to become pregnant while breastfeeding. If birth control is desired, ask your health care provider about options that will be safe while breastfeeding your baby. °Where to find more  information: °[employer]: www.llli.org °Contact a health care provider if: °· You feel like you want to stop breastfeeding or have become frustrated with breastfeeding. °· Your nipples are cracked or bleeding. °· Your breasts are red, tender, or warm. °· You have: °? Painful breasts or nipples. °? A swollen area on either breast. °? A fever or chills. °? Nausea or vomiting. °? Drainage other than breast milk from your nipples. °· Your breasts do not become full before feedings by the fifth day after you give birth. °· You feel sad and depressed. °· Your baby is: °? Too sleepy to eat well. °? Having trouble sleeping. °? More than 1 week old and wetting fewer than 6 diapers in a 24-hour period. °? Not gaining weight by 5 days of age. °· Your baby has fewer than 3 stools in a   24-hour period. °· Your baby's skin or the white parts of his or her eyes become yellow. °Get help right away if: °· Your baby is overly tired (lethargic) and does not want to wake up and feed. °· Your baby develops an unexplained fever. °Summary °· Breastfeeding offers many health benefits for infant and mothers. °· Try to breastfeed your infant when he or she shows early signs of hunger. °· Gently tickle or stroke your baby's lips with your finger or nipple to allow the baby to open his or her mouth. Bring the baby to your breast. Make sure that much of the areola is in your baby's mouth. Offer one side and burp the baby before you offer the other side. °· Talk with your health care provider or lactation consultant if you have questions or you face problems as you breastfeed. °This information is not intended to replace advice given to you by your health care provider. Make sure you discuss any questions you have with your health care provider. °Document Released: 08/05/2005 Document Revised: 09/06/2016 Document Reviewed: 09/06/2016 °Elsevier Interactive Patient Education © 2019 Elsevier Inc. ° °

## 2018-12-24 NOTE — Progress Notes (Signed)
Scheduled u/s appt 6/15 @ 215

## 2018-12-24 NOTE — Telephone Encounter (Signed)
Called the patient to confirm the appointment, the patient verbalized understanding. °

## 2018-12-24 NOTE — Progress Notes (Signed)
  Subjective:    Katrina May is a G2P0010 [redacted]w[redacted]d being seen today for her first obstetrical visit.  Her obstetrical history is significant for first pregnancy. Patient does intend to breast feed. Pregnancy history fully reviewed.  Patient reports nausea controlled with phenergan.  Vitals:   12/24/18 1513  BP: (!) 111/59  Pulse: 70  Weight: 115 lb (52.2 kg)    HISTORY: OB History  Gravida Para Term Preterm AB Living  2       1 0  SAB TAB Ectopic Multiple Live Births  1       0    # Outcome Date GA Lbr Len/2nd Weight Sex Delivery Anes PTL Lv  2 Current           1 SAB            Past Medical History:  Diagnosis Date  . Medical history non-contributory    Past Surgical History:  Procedure Laterality Date  . NO PAST SURGERIES     Family History  Problem Relation Age of Onset  . Diabetes Paternal Grandfather   . Healthy Mother   . Healthy Father      Exam    Uterus:     Pelvic Exam:    Perineum: Normal Perineum   Vulva: normal   Vagina:  normal mucosa, normal discharge   pH:    Cervix: nulliparous appearance   Adnexa: not evaluated   Bony Pelvis: gynecoid  System: Breast:  normal appearance, no masses or tenderness   Skin: normal coloration and turgor, no rashes    Neurologic: oriented, no focal deficits   Extremities: normal strength, tone, and muscle mass   HEENT extra ocular movement intact   Mouth/Teeth mucous membranes moist, pharynx normal without lesions and dental hygiene good   Neck supple and no masses   Cardiovascular: regular rate and rhythm   Respiratory:  appears well, vitals normal, no respiratory distress, acyanotic, normal RR, neck free of mass or lymphadenopathy, chest clear, no wheezing, crepitations, rhonchi, normal symmetric air entry   Abdomen: soft, non-tender; bowel sounds normal; no masses,  no organomegaly   Urinary:       Assessment:    Pregnancy: G2P0010 Patient Active Problem List   Diagnosis Date Noted  . Supervision of  low-risk pregnancy 12/08/2018        Plan:     Initial labs drawn. Prenatal vitamins. Problem list reviewed and updated. Genetic Screening discussed : panorama and horizon ordered.  Ultrasound discussed; fetal survey: ordered.  Follow up in 4 weeks. 50% of 20 min visit spent on counseling and coordination of care.     Fannie Gathright 12/24/2018

## 2018-12-25 LAB — OBSTETRIC PANEL, INCLUDING HIV
Basophils Absolute: 0 10*3/uL (ref 0.0–0.2)
Basos: 1 %
EOS (ABSOLUTE): 0.1 10*3/uL (ref 0.0–0.4)
Eos: 1 %
HIV Screen 4th Generation wRfx: NONREACTIVE
Hematocrit: 35.7 % (ref 34.0–46.6)
Hemoglobin: 12.4 g/dL (ref 11.1–15.9)
Hepatitis B Surface Ag: NEGATIVE
Immature Grans (Abs): 0 10*3/uL (ref 0.0–0.1)
Immature Granulocytes: 0 %
Lymphocytes Absolute: 2.2 10*3/uL (ref 0.7–3.1)
Lymphs: 25 %
MCH: 31.2 pg (ref 26.6–33.0)
MCHC: 34.7 g/dL (ref 31.5–35.7)
MCV: 90 fL (ref 79–97)
Monocytes Absolute: 0.7 10*3/uL (ref 0.1–0.9)
Monocytes: 8 %
Neutrophils Absolute: 5.7 10*3/uL (ref 1.4–7.0)
Neutrophils: 65 %
Platelets: 207 10*3/uL (ref 150–450)
RBC: 3.97 x10E6/uL (ref 3.77–5.28)
RDW: 13.7 % (ref 11.7–15.4)
RPR Ser Ql: REACTIVE — AB
Rh Factor: POSITIVE
Rubella Antibodies, IGG: 3.64 index (ref >0.99)
WBC: 8.7 10*3/uL (ref 3.4–10.8)

## 2018-12-25 LAB — AB SCR+ANTIBODY ID: Antibody Screen: POSITIVE — AB

## 2018-12-25 LAB — RPR, QUANT+TP ABS (REFLEX)
Rapid Plasma Reagin, Quant: 1:4 {titer} — ABNORMAL HIGH
T Pallidum Abs: NONREACTIVE

## 2018-12-26 LAB — URINE CULTURE, OB REFLEX

## 2018-12-26 LAB — CULTURE, OB URINE

## 2019-01-13 ENCOUNTER — Encounter: Payer: Self-pay | Admitting: *Deleted

## 2019-01-13 ENCOUNTER — Other Ambulatory Visit: Payer: Self-pay

## 2019-01-13 ENCOUNTER — Ambulatory Visit (INDEPENDENT_AMBULATORY_CARE_PROVIDER_SITE_OTHER): Payer: Medicaid Other | Admitting: General Practice

## 2019-01-13 DIAGNOSIS — R102 Pelvic and perineal pain: Secondary | ICD-10-CM

## 2019-01-13 NOTE — Progress Notes (Signed)
Patient dropped urine sample off at office for UTI evaluation. UA reveals some ketones and trace protein but otherwise negative. Called patient and she states she was having some occasional pelvic pain/abdominal pain that started in the front and wrapped to the back. Denies dysuria. Discussed UA wasn't indicative of UTI. Patient verbalized understanding & states she didn't think so but wanted to make sure. Patient states someone told her it could be round ligament pain. Told patient yes that is entirely possible. Patient verbalized understanding & had no questions.  Chase Caller RN BSN 01/13/19

## 2019-01-14 LAB — POCT URINALYSIS DIP (DEVICE)
Bilirubin Urine: NEGATIVE
Glucose, UA: NEGATIVE mg/dL
Ketones, ur: 80 mg/dL — AB
Leukocytes,Ua: NEGATIVE
Nitrite: NEGATIVE
Protein, ur: NEGATIVE mg/dL
Specific Gravity, Urine: 1.02 (ref 1.005–1.030)
Urobilinogen, UA: 1 mg/dL (ref 0.0–1.0)
pH: 7 (ref 5.0–8.0)

## 2019-01-21 ENCOUNTER — Other Ambulatory Visit: Payer: Self-pay

## 2019-01-21 ENCOUNTER — Telehealth (INDEPENDENT_AMBULATORY_CARE_PROVIDER_SITE_OTHER): Payer: Medicaid Other | Admitting: Obstetrics and Gynecology

## 2019-01-21 DIAGNOSIS — R109 Unspecified abdominal pain: Secondary | ICD-10-CM

## 2019-01-21 DIAGNOSIS — Z3492 Encounter for supervision of normal pregnancy, unspecified, second trimester: Secondary | ICD-10-CM

## 2019-01-21 DIAGNOSIS — O26892 Other specified pregnancy related conditions, second trimester: Secondary | ICD-10-CM

## 2019-01-21 DIAGNOSIS — Z3A17 17 weeks gestation of pregnancy: Secondary | ICD-10-CM

## 2019-01-21 NOTE — Progress Notes (Signed)
I have reviewed this chart and agree with the RN/CMA assessment and management.    Kyle Stansell C Rishik Tubby, MD, FACOG Attending Physician, [facility]  

## 2019-01-21 NOTE — Progress Notes (Signed)
I connected with  Katrina May on 01/21/19 at  4:15 PM EDT by telephone and verified that I am speaking with the correct person using two identifiers.   I discussed the limitations, risks, security and privacy concerns of performing an evaluation and management service by telephone and the availability of in person appointments. I also discussed with the patient that there may be a patient responsible charge related to this service. The patient expressed understanding and agreed to proceed.  Janene Madeira Harvard Zeiss, CMA 01/21/2019  3:21 PM

## 2019-01-21 NOTE — Progress Notes (Signed)
   TELEHEALTH VIRTUAL OBSTETRICS VISIT ENCOUNTER NOTE  I connected with Katrina May on 01/23/19 at  4:15 PM EDT by telephone at home and verified that I am speaking with the correct person using two identifiers.   I discussed the limitations, risks, security and privacy concerns of performing an evaluation and management service by telephone and the availability of in person appointments. I also discussed with the patient that there may be a patient responsible charge related to this service. The patient expressed understanding and agreed to proceed.  Subjective:  Katrina May is a 23 y.o. G2P0010 at 107w5d being followed for ongoing prenatal care.  She is currently monitored for the following issues for this low-risk pregnancy and has Supervision of low-risk pregnancy on their problem list.  Patient reports lower abdominal pain that is worse with movement. No bleeding or leaking of water. Reports + fetal movement. Denies any contractions, bleeding or leaking of fluid.   The following portions of the patient's history were reviewed and updated as appropriate: allergies, current medications, past family history, past medical history, past social history, past surgical history and problem list.   Objective:   General:  Alert, oriented and cooperative.   Mental Status: Normal mood and affect perceived. Normal judgment and thought content.  Rest of physical exam deferred due to type of encounter  Assessment and Plan:  Pregnancy: G2P0010 at [redacted]w[redacted]d  1. Encounter for supervision of low-risk pregnancy in second trimester  ? Early/ second trimester loss with first pregnancy.  Says she had no symptoms and delivered at home with cytotec. Says she was 15 weeks however was told the baby was "dead" for 3 weeks. Unable to obtain records d/t patient now knowing at this time the name of the office. She will find this information out.   BP she did not check. She will check tonight and log into babyscripts.   [place] ordered for cervical length due to pain and ?hx of second trimester loss.   Virtual visit in 4 weeks.   There are no diagnoses linked to this encounter. Preterm labor symptoms and general obstetric precautions including but not limited to vaginal bleeding, contractions, leaking of fluid and fetal movement were reviewed in detail with the patient.  I discussed the assessment and treatment plan with the patient. The patient was provided an opportunity to ask questions and all were answered. The patient agreed with the plan and demonstrated an understanding of the instructions. The patient was advised to call back or seek an in-person office evaluation/go to [facility] at [facility] for any urgent or concerning symptoms. Please refer to After Visit Summary for other counseling recommendations.   I provided 15 minutes of non-face-to-face time during this encounter.  Return in about 4 weeks (around 02/18/2019) for virtual visit in 4 weeks .  Future Appointments  Date Time Provider Department Center  02/01/2019  2:15 PM [facility]-MFC [place] 4 WH-MFCUS MFC-US  02/18/2019  1:55 PM Rasch, Harolyn Rutherford, NP [facility]A WOC    Venia Carbon, NP Center for [employer], [facility]

## 2019-01-23 ENCOUNTER — Other Ambulatory Visit: Payer: Self-pay

## 2019-01-23 ENCOUNTER — Inpatient Hospital Stay (HOSPITAL_COMMUNITY)
Admission: AD | Admit: 2019-01-23 | Discharge: 2019-01-23 | Disposition: A | Discharge status: Home / Self Care | Payer: Medicaid Other | Attending: Obstetrics and Gynecology | Admitting: Obstetrics and Gynecology

## 2019-01-23 DIAGNOSIS — R109 Unspecified abdominal pain: Secondary | ICD-10-CM | POA: Diagnosis present

## 2019-01-23 DIAGNOSIS — R102 Pelvic and perineal pain unspecified side: Secondary | ICD-10-CM

## 2019-01-23 DIAGNOSIS — O26899 Other specified pregnancy related conditions, unspecified trimester: Secondary | ICD-10-CM

## 2019-01-23 DIAGNOSIS — O26892 Other specified pregnancy related conditions, second trimester: Secondary | ICD-10-CM | POA: Diagnosis not present

## 2019-01-23 DIAGNOSIS — Z3A18 18 weeks gestation of pregnancy: Secondary | ICD-10-CM | POA: Diagnosis not present

## 2019-01-23 LAB — URINALYSIS, ROUTINE W REFLEX MICROSCOPIC
Bilirubin Urine: NEGATIVE
Glucose, UA: NEGATIVE mg/dL
Hgb urine dipstick: NEGATIVE
Ketones, ur: NEGATIVE mg/dL
Leukocytes,Ua: NEGATIVE
Nitrite: NEGATIVE
Protein, ur: NEGATIVE mg/dL
Specific Gravity, Urine: 1.015 (ref 1.005–1.030)
pH: 7 (ref 5.0–8.0)

## 2019-01-23 MED ORDER — COMFORT FIT MATERNITY SUPP SM MISC: 1.0000 [IU] | Freq: Every day | 0 refills | Status: DC | PRN

## 2019-01-23 NOTE — Discharge Instructions (Signed)
PREGNANCY SUPPORT BELT: You are not alone, Seventy-five percent of women have some sort of abdominal or back pain at some point in their pregnancy. Your baby is growing at a fast pace, which means that your whole body is rapidly trying to adjust to the changes. As your uterus grows, your back may start feeling a bit under stress and this can result in back or abdominal pain that can go from mild, and therefore bearable, to severe pains that will not allow you to sit or lay down comfortably, When it comes to dealing with pregnancy-related pains and cramps, some pregnant women usually prefer natural remedies, which the market is filled with nowadays. For example, wearing a pregnancy support belt can help ease and lessen your discomfort and pain. WHAT ARE THE BENEFITS OF WEARING A PREGNANCY SUPPORT BELT? A pregnancy support belt provides support to the lower portion of the belly taking some of the weight of the growing uterus and distributing to the other parts of your body. It is designed make you comfortable and gives you extra support. Over the years, the pregnancy apparel market has been studying the needs and wants of pregnant women and they have come up with the most comfortable pregnancy support belts that woman could ever ask for. In fact, you will no longer have to wear a stretched-out or bulky pregnancy belt that is visible underneath your clothes and makes you feel even more uncomfortable. Nowadays, a pregnancy support belt is made of comfortable and stretchy materials that will not irritate your skin but will actually make you feel at ease and you will not even notice you are wearing it. They are easy to put on and adjust during the day and can be worn at night for additional support.  BENEFITS:  Relives Back pain  Relieves Abdominal Muscle and Leg Pain  Stabilizes the Pelvic Ring  Offers a Cushioned Abdominal Lift Pad  Relieves pressure on the Sciatic Nerve Within Minutes WHERE TO GET  YOUR PREGNANCY BELT: [employer] [phone] [phone], [place] [id number]       Abdominal Pain During Pregnancy  Abdominal pain is common during pregnancy, and has many possible causes. Some causes are more serious than others, and sometimes the cause is not known. Abdominal pain can be a sign that labor is starting. It can also be caused by normal growth and stretching of muscles and ligaments during pregnancy. Always tell your health care provider if you have any abdominal pain. Follow these instructions at home:  Do not have sex or put anything in your vagina until your pain goes away completely.  Get plenty of rest until your pain improves.  Drink enough fluid to keep your urine pale yellow.  Take over-the-counter and prescription medicines only as told by your health care provider.  Keep all follow-up visits as told by your health care provider. This is important. Contact a health care provider if:  Your pain continues or gets worse after resting.  You have lower abdominal pain that: ? Comes and goes at regular intervals. ? Spreads to your back. ? Is similar to menstrual cramps.  You have pain or burning when you urinate. Get help right away if:  You have a fever or chills.  You have vaginal bleeding.  You are leaking fluid from your vagina.  You are passing tissue from your vagina.  You have vomiting or diarrhea that lasts for more than 24 hours.  Your baby is  moving less than usual.  You feel very weak or faint.  You have shortness of breath.  You develop severe pain in your upper abdomen. Summary  Abdominal pain is common during pregnancy, and has many possible causes.  If you experience abdominal pain during pregnancy, tell your health care provider right away.  Follow your health care provider's home care instructions and keep all follow-up visits as directed. This information is not intended to replace advice given  to you by your health care provider. Make sure you discuss any questions you have with your health care provider. Document Released: 08/05/2005 Document Revised: 11/07/2016 Document Reviewed: 11/07/2016 Elsevier Interactive Patient Education  2019 [employer]

## 2019-01-23 NOTE — MAU Note (Signed)
Urine in lab 

## 2019-01-23 NOTE — MAU Note (Signed)
Pt reports abdominal pain started about 4-5 days ago, she contacted someone through the mychart app a week ago and they told her it was likely round ligament pain. She felt last night she was unable to sleep so she came in today. Pain is below umbilicus to groin that if she shifts positions it shifts to that side, it is constant, she is unable to rest adequately. She reports coughing, sneezing, or any pressure makes the pain worse. Pain is from a 4/10 resting to 8-9/10 at worst. Pt reports not trying any medications, tried a heating pad that was helpful while using it. Denies N/V/D. Pt had intercourse last night.

## 2019-01-23 NOTE — MAU Provider Note (Signed)
Chief Complaint: Abdominal Pain   First Provider Initiated Contact with Patient 01/23/19 1310     SUBJECTIVE HPI: Katrina May is a 23 y.o. G2P0010 at 5086w0d who presents to Maternity Admissions reporting abdominal pain. Symptoms began last week. Reports constant pressure that is worse when she sneezes, coughs, or voids. Denies n/v/d, constipation, dysuria, hematuria, vaginal bleeding, or LOF.   Location: abdomen Quality: pressure, sharp Severity: 4/10 on pain scale Duration: 1 week Timing: constant pressure. Intermittent sharp pain Modifying factors: sharp pain with movement, coughing, & sneezing Associated signs and symptoms: none  Past Medical History:  Diagnosis Date  . Medical history non-contributory    OB History  Gravida Para Term Preterm AB Living  2       1 0  SAB TAB Ectopic Multiple Live Births  1       0    # Outcome Date GA Lbr Len/2nd Weight Sex Delivery Anes PTL Lv  2 Current           1 SAB            Past Surgical History:  Procedure Laterality Date  . NO PAST SURGERIES     Social History   Socioeconomic History  . Marital status: Single    Spouse name: Not on file  . Number of children: Not on file  . Years of education: Not on file  . Highest education level: Not on file  Occupational History    Comment: unemployed  Social Needs  . Financial resource strain: Not on file  . Food insecurity:    Worry: Never true    Inability: Never true  . Transportation needs:    Medical: No    Non-medical: No  Tobacco Use  . Smoking status: Never Smoker  . Smokeless tobacco: Never Used  Substance and Sexual Activity  . Alcohol use: Never    Frequency: Never  . Drug use: Never  . Sexual activity: Yes  Lifestyle  . Physical activity:    Days per week: Not on file    Minutes per session: Not on file  . Stress: Not on file  Relationships  . Social connections:    Talks on phone: Not on file    Gets together: Not on file    Attends religious service:  Not on file    Active member of club or organization: Not on file    Attends meetings of clubs or organizations: Not on file    Relationship status: Not on file  . Intimate partner violence:    Fear of current or ex partner: No    Emotionally abused: No    Physically abused: No    Forced sexual activity: No  Other Topics Concern  . Not on file  Social History Narrative  . Not on file   Family History  Problem Relation Age of Onset  . Diabetes Paternal Grandfather   . Healthy Mother   . Healthy Father    No current facility-administered medications on file prior to encounter.    Current Outpatient Medications on File Prior to Encounter  Medication Sig Dispense Refill  . Prenatal Vit-Fe Fumarate-FA (PRENATAL MULTIVITAMIN) TABS tablet Take 1 tablet by mouth daily at 12 noon.    . promethazine (PHENERGAN) 25 MG tablet Take 1 tablet (25 mg total) by mouth every 6 (six) hours as needed for nausea or vomiting. 30 tablet 1   No Known Allergies  I have reviewed patient's Past Medical Hx, Surgical Hx, Family  Hx, Social Hx, medications and allergies.   Review of Systems  Constitutional: Negative.   Gastrointestinal: Positive for abdominal pain. Negative for constipation, diarrhea, nausea and vomiting.  Genitourinary: Negative.     OBJECTIVE Patient Vitals for the past 24 hrs:  BP Temp Temp src Pulse Resp SpO2 Height Weight  01/23/19 1340 120/75 - - 85 18 100 % - -  01/23/19 1240 113/63 98.9 F (37.2 C) Oral 83 18 97 % - -  01/23/19 1228 - - - - - - 5' (1.524 m) 51.8 kg   Constitutional: Well-developed, well-nourished female in no acute distress.  Cardiovascular: normal rate & rhythm, no murmur Respiratory: normal rate and effort. Lung sounds clear throughout GI: Abd soft, non-tender, Pos BS x 4. No guarding or rebound tenderness MS: Extremities nontender, no edema, normal ROM Neurologic: Alert and oriented x 4.  GU: cervix closed/thick    LAB RESULTS Results for orders  placed or performed during the hospital encounter of 01/23/19 (from the past 24 hour(s))  Urinalysis, Routine w reflex microscopic     Status: Abnormal   Collection Time: 01/23/19  1:11 PM  Result Value Ref Range   Color, Urine YELLOW YELLOW   APPearance CLOUDY (A) CLEAR   Specific Gravity, Urine 1.015 1.005 - 1.030   pH 7.0 5.0 - 8.0   Glucose, UA NEGATIVE NEGATIVE mg/dL   Hgb urine dipstick NEGATIVE NEGATIVE   Bilirubin Urine NEGATIVE NEGATIVE   Ketones, ur NEGATIVE NEGATIVE mg/dL   Protein, ur NEGATIVE NEGATIVE mg/dL   Nitrite NEGATIVE NEGATIVE   Leukocytes,Ua NEGATIVE NEGATIVE    IMAGING No results found.  MAU COURSE Orders Placed This Encounter  Procedures  . Urinalysis, Routine w reflex microscopic  . Discharge patient   Meds ordered this encounter  Medications  . Elastic Bandages & Supports (COMFORT FIT MATERNITY SUPP SM) MISC    Sig: 1 Units by Does not apply route daily as needed.    Dispense:  1 each    Refill:  0    Order Specific Question:   Supervising Provider    Answer:   Arlina Robes L [[id number]]    MDM FHT present via doppler Cervix closed/thick U/a negative for signs of infection  ASSESSMENT 1. Pain of round ligament affecting pregnancy, antepartum   2. [redacted] weeks gestation of pregnancy     PLAN Discharge home in stable condition. SAB precautions Rx maternity support belt  Allergies as of 01/23/2019   No Known Allergies     Medication List    TAKE these medications   Comfort Fit Maternity Supp Sm Misc 1 Units by Does not apply route daily as needed.   prenatal multivitamin Tabs tablet Take 1 tablet by mouth daily at 12 noon.   promethazine 25 MG tablet Commonly known as:  PHENERGAN Take 1 tablet (25 mg total) by mouth every 6 (six) hours as needed for nausea or vomiting.        Jorje Guild, NP 01/23/2019  2:48 PM

## 2019-02-01 ENCOUNTER — Other Ambulatory Visit: Payer: Self-pay | Admitting: Obstetrics and Gynecology

## 2019-02-01 ENCOUNTER — Other Ambulatory Visit: Payer: Self-pay

## 2019-02-01 ENCOUNTER — Ambulatory Visit (HOSPITAL_COMMUNITY)
Admission: RE | Admit: 2019-02-01 | Discharge: 2019-02-01 | Disposition: A | Discharge status: Home / Self Care | Payer: Medicaid Other | Source: Ambulatory Visit | Attending: Obstetrics and Gynecology | Admitting: Obstetrics and Gynecology

## 2019-02-01 DIAGNOSIS — Z3A19 19 weeks gestation of pregnancy: Secondary | ICD-10-CM

## 2019-02-01 DIAGNOSIS — O359XX0 Maternal care for (suspected) fetal abnormality and damage, unspecified, not applicable or unspecified: Secondary | ICD-10-CM | POA: Diagnosis not present

## 2019-02-01 DIAGNOSIS — Z3492 Encounter for supervision of normal pregnancy, unspecified, second trimester: Secondary | ICD-10-CM

## 2019-02-01 DIAGNOSIS — Z363 Encounter for antenatal screening for malformations: Secondary | ICD-10-CM

## 2019-02-18 ENCOUNTER — Telehealth: Payer: Medicaid Other | Admitting: Obstetrics and Gynecology

## 2019-02-18 ENCOUNTER — Other Ambulatory Visit: Payer: Self-pay

## 2019-02-18 ENCOUNTER — Telehealth: Payer: Self-pay | Admitting: Obstetrics and Gynecology

## 2019-02-18 NOTE — Telephone Encounter (Signed)
The patient called in stating she waiting on mychart and no one never came. After speaking with Elmyra Ricks it was decided to reschedule the appointment as the patient did not log in to [place]. Informed the patient of how to access the virtual visit. The patient verbalized understanding.

## 2019-02-18 NOTE — Progress Notes (Unsigned)
@  1103 Lvm to call office to reschedule appointment

## 2019-02-22 ENCOUNTER — Telehealth: Payer: Self-pay | Admitting: Obstetrics & Gynecology

## 2019-02-22 NOTE — Telephone Encounter (Signed)
Left a voicemail message instructing the patient of how enter the virtual visit via mychart. Informed the patient of logging in 15 minutes prior to the appointment and if she has any questions please call our office at [phone]. °

## 2019-02-23 ENCOUNTER — Encounter: Payer: Medicaid Other | Admitting: Medical

## 2019-02-23 ENCOUNTER — Other Ambulatory Visit: Payer: Self-pay

## 2019-02-23 ENCOUNTER — Telehealth: Payer: Self-pay | Admitting: Medical

## 2019-02-23 NOTE — Progress Notes (Signed)
2:32P- Called Pt for My Chart visit no answer, left VM stating will retry in 10 to 15 minutes.   2:46p- 2nd attempt, no answer, left VM sttng appointment will need to be rescheduled.

## 2019-02-24 ENCOUNTER — Inpatient Hospital Stay (HOSPITAL_COMMUNITY)
Admission: AD | Admit: 2019-02-24 | Discharge: 2019-02-25 | Disposition: A | Discharge status: Home / Self Care | Payer: Medicaid Other | Attending: Obstetrics & Gynecology | Admitting: Obstetrics & Gynecology

## 2019-02-24 ENCOUNTER — Other Ambulatory Visit: Payer: Self-pay

## 2019-02-24 DIAGNOSIS — Z8742 Personal history of other diseases of the female genital tract: Secondary | ICD-10-CM

## 2019-02-24 DIAGNOSIS — Z3A22 22 weeks gestation of pregnancy: Secondary | ICD-10-CM | POA: Insufficient documentation

## 2019-02-24 DIAGNOSIS — O4692 Antepartum hemorrhage, unspecified, second trimester: Secondary | ICD-10-CM | POA: Insufficient documentation

## 2019-02-24 LAB — URINALYSIS, ROUTINE W REFLEX MICROSCOPIC
Bilirubin Urine: NEGATIVE
Glucose, UA: NEGATIVE mg/dL
Hgb urine dipstick: NEGATIVE
Ketones, ur: NEGATIVE mg/dL
Leukocytes,Ua: NEGATIVE
Nitrite: NEGATIVE
Protein, ur: NEGATIVE mg/dL
Specific Gravity, Urine: 1.018 (ref 1.005–1.030)
pH: 6 (ref 5.0–8.0)

## 2019-02-24 NOTE — MAU Note (Signed)
Pt reports to MAU c/o bleeding when she urinates. Pt states in the morning when she has the first urine it comes out bloody/brownish then thru the day she sees cloudy urine and vaginal discharge. Pt states her discharge is a off white color. +FM but decreased since the bleeding. Pt reports LUQ pain that is a 6/10 and is sharp. Pt also has lower abdominal cramping that is a 3/10 that feels uncomfortable like she needs to pee.

## 2019-02-25 ENCOUNTER — Encounter (HOSPITAL_COMMUNITY): Payer: Self-pay | Admitting: *Deleted

## 2019-02-25 DIAGNOSIS — Z3A22 22 weeks gestation of pregnancy: Secondary | ICD-10-CM | POA: Diagnosis not present

## 2019-02-25 DIAGNOSIS — O4692 Antepartum hemorrhage, unspecified, second trimester: Secondary | ICD-10-CM | POA: Diagnosis present

## 2019-02-25 LAB — WET PREP, GENITAL
Clue Cells Wet Prep HPF POC: NONE SEEN
Sperm: NONE SEEN
Trich, Wet Prep: NONE SEEN
Yeast Wet Prep HPF POC: NONE SEEN

## 2019-02-25 NOTE — MAU Provider Note (Addendum)
Patient Katrina May is a 23 y.o. G2P0010 at [id number] here with complaints of vaginal bleeding one time each morning for the past three days. She also has some occasional abdominal cramping that comes and goes for short periods of time throughout the day.  She denies contractions, pain with urination, other abnormal discharge, back pain, fever, chills, SOB. She feels irregular fetal movements.   She last had intercourse 7 days ago.  History     CSN: [id number]  Arrival date and time: 02/24/19 2200   First Provider Initiated Contact with Patient 02/25/19 0053      Chief Complaint  Patient presents with  . Abdominal Pain  . Vaginal Bleeding    possible from urine pt unsure   Abdominal Pain This is a new problem. The problem occurs intermittently. The problem has been unchanged. The pain is at a severity of 4/10. Pain radiation: radiates to her groin. Associated symptoms include constipation. Pertinent negatives include no diarrhea or dysuria.  Vaginal Bleeding The patient's primary symptoms include vaginal bleeding. This is a new problem. Episode frequency: just once each morning for the past three days. Associated symptoms include abdominal pain, constipation and discolored urine. Pertinent negatives include no diarrhea or dysuria.  She says that her urine is dark brown/bloody looking in the morning. It has been this way for the past three days; she doesn't know if this is bleeding or not but she mentioned it to her nurse today, who suggested she come to MAU. She has constipation, but does not think her possible bleeding is coming from straining.   OB History    Gravida  2   Para      Term      Preterm      AB  1   Living  0     SAB  1   TAB      Ectopic      Multiple      Live Births  0           Past Medical History:  Diagnosis Date  . Medical history non-contributory     Past Surgical History:  Procedure Laterality Date  . NO PAST SURGERIES      Family  History  Problem Relation Age of Onset  . Diabetes Paternal Grandfather   . Healthy Mother   . Healthy Father     Social History   Tobacco Use  . Smoking status: Never Smoker  . Smokeless tobacco: Never Used  Substance Use Topics  . Alcohol use: Never    Frequency: Never  . Drug use: Never    Allergies: No Known Allergies  Medications Prior to Admission  Medication Sig Dispense Refill Last Dose  . Prenatal Vit-Fe Fumarate-FA (PRENATAL MULTIVITAMIN) TABS tablet Take 1 tablet by mouth daily at 12 noon.   02/24/2019 at Unknown time  . Elastic Bandages & Supports (COMFORT FIT MATERNITY SUPP SM) MISC 1 Units by Does not apply route daily as needed. 1 each 0 Unknown at Unknown time  . promethazine (PHENERGAN) 25 MG tablet Take 1 tablet (25 mg total) by mouth every 6 (six) hours as needed for nausea or vomiting. 30 tablet 1 More than a month at Unknown time    Review of Systems  Gastrointestinal: Positive for abdominal pain and constipation. Negative for diarrhea.  Genitourinary: Positive for vaginal bleeding. Negative for dysuria.   Physical Exam   Blood pressure 120/72, pulse 80, temperature 98.5 F (36.9 C), temperature source Oral, resp. rate  17, last menstrual period 09/19/2018.  Physical Exam  Constitutional: She appears well-developed and well-nourished.  HENT:  Head: Normocephalic.  Neck: Normal range of motion.  Respiratory: Effort normal.  GI: Soft.  Genitourinary:    Vagina normal.     Genitourinary Comments: NEFG; no blood in the vagina; no discharge. Cervix is long, closed. No CMT, suprapubic or adnexal tenderness.    Musculoskeletal: Normal range of motion.  Neurological: She is alert.  Skin: Skin is warm.    MAU Course  Procedures  MDM -FHR: 150 -UA is normal, no signs of infection.  -gc pending, -wet prep is negative -blood type is O positive Assessment and Plan   1. History of vaginal bleeding    -Patient stable for discharge; no evidence of  bleeding on exam or during MAU stay.  -Keep follow up ob appts.  -Return to MAU if symptoms change or worsen; reviewed bleeding precautions.   Katrina May 02/25/2019, 1:07 AM

## 2019-02-25 NOTE — Discharge Instructions (Signed)

## 2019-02-26 LAB — GC/CHLAMYDIA PROBE AMP (~~LOC~~) NOT AT ARMC
Chlamydia: NEGATIVE
Neisseria Gonorrhea: NEGATIVE

## 2019-03-01 ENCOUNTER — Other Ambulatory Visit: Payer: Self-pay

## 2019-03-01 ENCOUNTER — Encounter: Payer: Self-pay | Admitting: Advanced Practice Midwife

## 2019-03-01 ENCOUNTER — Telehealth (INDEPENDENT_AMBULATORY_CARE_PROVIDER_SITE_OTHER): Payer: Medicaid Other | Admitting: Advanced Practice Midwife

## 2019-03-01 DIAGNOSIS — Z3492 Encounter for supervision of normal pregnancy, unspecified, second trimester: Secondary | ICD-10-CM | POA: Diagnosis not present

## 2019-03-01 DIAGNOSIS — Z3A23 23 weeks gestation of pregnancy: Secondary | ICD-10-CM

## 2019-03-01 DIAGNOSIS — Z3490 Encounter for supervision of normal pregnancy, unspecified, unspecified trimester: Secondary | ICD-10-CM

## 2019-03-01 NOTE — Progress Notes (Signed)
   TELEHEALTH VIRTUAL OBSTETRICS VISIT ENCOUNTER NOTE  I connected with Katrina May on 03/01/19 at  3:15 PM EDT by telephone at home and verified that I am speaking with the correct person using two identifiers.   I discussed the limitations, risks, security and privacy concerns of performing an evaluation and management service by telephone and the availability of in person appointments. I also discussed with the patient that there may be a patient responsible charge related to this service. The patient expressed understanding and agreed to proceed.  Subjective:  Katrina May is a 23 y.o. G2P0010 at [redacted]w[redacted]d being followed for ongoing prenatal care.  She is currently monitored for the following issues for this low-risk pregnancy and has Supervision of low-risk pregnancy on their problem list.  Patient reports no complaints. Reports fetal movement. Denies any contractions, bleeding or leaking of fluid.   The following portions of the patient's history were reviewed and updated as appropriate: allergies, current medications, past family history, past medical history, past social history, past surgical history and problem list.   Objective:   General:  Alert, oriented and cooperative.   Mental Status: Normal mood and affect perceived. Normal judgment and thought content.  Rest of physical exam deferred due to type of encounter  Assessment and Plan:  Pregnancy: G2P0010 at [redacted]w[redacted]d 1. Encounter for supervision of low-risk pregnancy, antepartum - Routine care - CBC; Future - HIV Antibody (routine testing w rflx); Future - RPR; Future - Glucose Tolerance, 2 Hours w/1 Hour; Future - Undecided re: contraception started to talk about options today  Preterm labor symptoms and general obstetric precautions including but not limited to vaginal bleeding, contractions, leaking of fluid and fetal movement were reviewed in detail with the patient.  I discussed the assessment and treatment plan with the  patient. The patient was provided an opportunity to ask questions and all were answered. The patient agreed with the plan and demonstrated an understanding of the instructions. The patient was advised to call back or seek an in-person office evaluation/go to MAU at [facility] for any urgent or concerning symptoms. Please refer to After Visit Summary for other counseling recommendations.   I provided 15 minutes of non-face-to-face time during this encounter.  Return in about 4 weeks (around 03/29/2019) for 28 week labs and GTT.  No future appointments.  Marcille Buffy, [place] [facility] [employer], [place]

## 2019-03-01 NOTE — Patient Instructions (Signed)
Contraception Choices Contraception, also called birth control, refers to methods or devices that prevent pregnancy. Hormonal methods Contraceptive implant  A contraceptive implant is a thin, plastic tube that contains a hormone. It is inserted into the upper part of the arm. It can remain in place for up to 3 years. Progestin-only injections Progestin-only injections are injections of progestin, a synthetic form of the hormone progesterone. They are given every 3 months by a health care provider. Birth control pills  Birth control pills are pills that contain hormones that prevent pregnancy. They must be taken once a day, preferably at the same time each day. Birth control patch  The birth control patch contains hormones that prevent pregnancy. It is placed on the skin and must be changed once a week for three weeks and removed on the fourth week. A prescription is needed to use this method of contraception. Vaginal ring  A vaginal ring contains hormones that prevent pregnancy. It is placed in the vagina for three weeks and removed on the fourth week. After that, the process is repeated with a new ring. A prescription is needed to use this method of contraception. Emergency contraceptive Emergency contraceptives prevent pregnancy after unprotected sex. They come in pill form and can be taken up to 5 days after sex. They work best the sooner they are taken after having sex. Most emergency contraceptives are available without a prescription. This method should not be used as your only form of birth control. Barrier methods Female condom  A female condom is a thin sheath that is worn over the penis during sex. Condoms keep sperm from going inside a woman's body. They can be used with a spermicide to increase their effectiveness. They should be disposed after a single use. Female condom  A female condom is a soft, loose-fitting sheath that is put into the vagina before sex. The condom keeps sperm  from going inside a woman's body. They should be disposed after a single use. Diaphragm  A diaphragm is a soft, dome-shaped barrier. It is inserted into the vagina before sex, along with a spermicide. The diaphragm blocks sperm from entering the uterus, and the spermicide kills sperm. A diaphragm should be left in the vagina for 6-8 hours after sex and removed within 24 hours. A diaphragm is prescribed and fitted by a health care provider. A diaphragm should be replaced every 1-2 years, after giving birth, after gaining more than 15 lb (6.8 kg), and after pelvic surgery. Cervical cap  A cervical cap is a round, soft latex or plastic cup that fits over the cervix. It is inserted into the vagina before sex, along with spermicide. It blocks sperm from entering the uterus. The cap should be left in place for 6-8 hours after sex and removed within 48 hours. A cervical cap must be prescribed and fitted by a health care provider. It should be replaced every 2 years. Sponge  A sponge is a soft, circular piece of polyurethane foam with spermicide on it. The sponge helps block sperm from entering the uterus, and the spermicide kills sperm. To use it, you make it wet and then insert it into the vagina. It should be inserted before sex, left in for at least 6 hours after sex, and removed and thrown away within 30 hours. Spermicides Spermicides are chemicals that kill or block sperm from entering the cervix and uterus. They can come as a cream, jelly, suppository, foam, or tablet. A spermicide should be inserted into the   vagina with an applicator at least 10-15 minutes before sex to allow time for it to work. The process must be repeated every time you have sex. Spermicides do not require a prescription. Intrauterine contraception Intrauterine device (IUD) An IUD is a T-shaped device that is put in a woman's uterus. There are two types:  Hormone IUD.This type contains progestin, a synthetic form of the hormone  progesterone. This type can stay in place for 3-5 years.  Copper IUD.This type is wrapped in copper wire. It can stay in place for 10 years.  Permanent methods of contraception Female tubal ligation In this method, a woman's fallopian tubes are sealed, tied, or blocked during surgery to prevent eggs from traveling to the uterus. Hysteroscopic sterilization In this method, a small, flexible insert is placed into each fallopian tube. The inserts cause scar tissue to form in the fallopian tubes and block them, so sperm cannot reach an egg. The procedure takes about 3 months to be effective. Another form of birth control must be used during those 3 months. Female sterilization This is a procedure to tie off the tubes that carry sperm (vasectomy). After the procedure, the man can still ejaculate fluid (semen). Natural planning methods Natural family planning In this method, a couple does not have sex on days when the woman could become pregnant. Calendar method This means keeping track of the length of each menstrual cycle, identifying the days when pregnancy can happen, and not having sex on those days. Ovulation method In this method, a couple avoids sex during ovulation. Symptothermal method This method involves not having sex during ovulation. The woman typically checks for ovulation by watching changes in her temperature and in the consistency of cervical mucus. Post-ovulation method In this method, a couple waits to have sex until after ovulation. Summary  Contraception, also called birth control, means methods or devices that prevent pregnancy.  Hormonal methods of contraception include implants, injections, pills, patches, vaginal rings, and emergency contraceptives.  Barrier methods of contraception can include female condoms, female condoms, diaphragms, cervical caps, sponges, and spermicides.  There are two types of IUDs (intrauterine devices). An IUD can be put in a woman's uterus to  prevent pregnancy for 3-5 years.  Permanent sterilization can be done through a procedure for males, females, or both.  Natural family planning methods involve not having sex on days when the woman could become pregnant. This information is not intended to replace advice given to you by your health care provider. Make sure you discuss any questions you have with your health care provider. Document Released: 08/05/2005 Document Revised: 08/07/2017 Document Reviewed: 09/07/2016 Elsevier Patient Education  2020 Elsevier Inc.   AREA PEDIATRIC/FAMILY PRACTICE PHYSICIANS  [place] ([address]) . [facility] o Chambliss, MD; Eniola, MD; Hale, MD; Hensel, MD; McDiarmid, MD; McIntyer, MD; Neal, MD; Walden, MD o [address] [place], [place] [address] o [phone] o Mon-Fri 8:30-12:30, 1:30-5:00 o Providers come to see babies at [facility] o Accepting Medicaid . [facility] o Limited providers who accept newborns: Koirala, MD; Morrow, MD; Wolters, MD o [address], [place], [place] [address] o [phone] o Mon-Fri 8:00-5:30 o Babies seen by providers at [facility] o Does NOT accept Medicaid o Please call early in hospitalization for appointment (limited availability)  . [facility] o Mulberry, MD o [address] [place], [place] [address] o [phone] o Mon, Tue, Thur, Fri 8:30-5:00, Wed 10:00-7:00 (closed   1-2pm) o Babies seen by [facility] providers o Accepting Medicaid . [facility] o Rubin, MD o [address], [place], [place] [address] o [phone] o Mon-Fri 8:30-5:00, Sat 8:30-12:00 o Provider comes to see babies at [facility] o Accepting Medicaid o Must have been referred from current patients or contacted office prior to delivery . [facility] ([facility]) o  Brown, MD; Chandler, MD; Ettefagh, MD; Grant, MD; Lester, MD; McCormick, MD; McQueen, MD; Prose, MD; Simha, MD; Stanley, MD; Stryffeler, NP; Tebben, NP o [address], [place], [place] [address] o [phone] o Mon, Tue, Thur, Fri 8:30-5:30, Wed 9:30-5:30, Sat 8:30-12:30 o Babies seen by [facility] providers o Accepting Medicaid o Only accepting infants of first-time parents or siblings of current patients o Hospital discharge coordinator will make follow-up appointment . Jack Amos o [address], [place], [place]  [address] o [phone]   Fax - [phone] . [facility] o [address], [place], [place]  [address] o Phone - [phone]   Fax - [phone] . Shilpa Gosrani o [address], [place], [place]  [address] o [phone]  [place] ([address]) . [facility] o Bates, MD; [place], MD; Cooper, Cox, MD; MD; Davis, MD; Dovico, MD; Ettefaugh, MD; Little, MD; Lowe, MD; Keiffer, MD; Melvin, MD; Sumner, MD; Williams, MD o [address], [place], [place] [address] o [phone] o Mon-Fri 8:30-5:00 (extended evenings Mon-Thur as needed), Sat-Sun 10:00-1:00 o Providers come to see babies at [facility] o Accepting Medicaid for families of first-time babies and families with all children in the household age 3 and under. Must register with office prior to making appointment (M-F only). . [facility] o Henson, NP; Knapp, MD; Lalonde, MD; Tysinger, PA o [address] [place], [place] [address] o [phone] o Mon-Fri 8:00-5:00 o Babies seen by providers at [facility] o Does NOT accept Medicaid/Commercial Insurance Only . [facility] ([facility])  o Artis, MD; Barnes, MD; Bratton, MD; Coccaro, MD; [place], MD; Kramer, MD; Marshall, MD; Netherton, MD; Poleto, MD; Skinner, MD o [address] [place], [place]  [address] o [phone] o Mon-Fri 8:30-5:30, Sat (Oct.-Mar.) 9:00-1:00 o Babies seen by providers at [facility] o Accepting Medicaid  [place] ([address]) . [facility] o Reid, MD; Warner, MD o [address], [place], [place] [address] o [phone] o Mon-Fri 8:30-5:00, Sat 8:30-12:00 o Providers come to see babies at [facility] o Does NOT accept Medicaid . [facility] o Becker, PA; Hagler, MD; Scifres, PA; Sun, MD; Swayne, MD o [address], [place], [place] [address] o [phone] o Mon-Fri 8:00-5:00 o Babies seen by providers at [facility] o Does NOT accept Medicaid o Only accepting babies of parents who are patients o Please call early in hospitalization for appointment (limited availability) . [facility] o Clark, MD; Frye, MD; Kelleher, MD; Mack, NP; Miller, MD; O'Keller, MD; Patterson, NP; Pudlo, MD; Puzio, MD; Thomas, MD; Tucker, MD; Twiselton, MD o [address], [place],  [address] o [phone] o Mon-Fri 8:00-5:00, Sat 9:00-12:00 o Providers come to see babies at [facility] o Does NOT accept Medicaid  [place]  ([address]) . [facility] o Limited providers accepting new patients: Brake, NP; Wharton, PA o [address], [place], [place] [address] o [phone] o Mon-Fri 8:00-5:00 o Babies seen by providers at [facility] o Does NOT accept Medicaid o Only accepting babies of parents who are patients o Please call early in hospitalization for appointment (limited availability) . [facility] o Gay, MD; Quinlan, MD o [address] [place], [place] [address] o [phone] (press 1 to schedule appointment) o Mon-Fri 8:00-5:00 o Providers come to see babies at [facility] o Does NOT accept Medicaid . [facility] o Mazer, MD o [address] [place], [place] [address] o  [phone] o Mon-Fri 8:30-5:00 (lunch 12:30-1:00), extended hours by appointment only Wed 5:00-6:30 o Babies seen by [facility] providers o Accepting Medicaid . [facility] o Banks, MD; Jordan, MD; Koberlein, MD o [address], [place], [place] [address] o [phone] o Mon-Fri 8:00-5:00 o Babies seen by [facility] providers o Does NOT accept Medicaid . [facility] o Parker, MD; Hunter, MD; Wallace, DO o [address] [place], [place] [address] o [phone] o Mon-Fri 8:00-5:00 o Babies seen by [facility] providers o Does NOT accept Medicaid . [facility] o Brandon, PA; Brecken, PA; Christy, NP; Dees, MD; DeClaire, MD; DeWeese, MD; Hansen, NP; Mills, NP; Parrish, NP; Smoot, NP; Summer, MD; Vapne, MD o [address] [place], [place] [address] o [phone] o Mon-Fri 8:30-5:00, Sat 10:00-1:00 o Providers come to see babies at [facility] o Does NOT accept Medicaid o Free prenatal information session Tuesdays at 4:45pm . [facility] o Bouska, MD; Gordon, PA; Jeffery, PA; Weber, PA o [address] [place] [address] o [phone] o Mon-Fri 7:30-5:30 o Babies seen by [facility] providers . [facility] o [address], [place], [place]  [address] o [phone]   Fax - [phone]  [place] ([address] & [address]) . [facility] o Reese, MD o [address] [place], [place] [address] o [phone] o Mon-Thur 8:00-6:00 o Providers come to see babies at [facility] o Accepting Medicaid . [facility] o Anderson, NP; Badger, MD; Beal, PA; Spencer, PA o [address] [place], [place] [address] o [phone] o Mon-Thur 7:30-7:30, Fri 7:30-4:30 o Babies seen by [facility] providers o Accepting Medicaid . [facility] o Agbuya, MD; Klett, NP;  Romgoolam, MD o [address], [place], [place] [address] o [phone] o Mon-Fri 8:30-5:00, Sat 8:30-12:00 o Providers come to see babies at [facility] o Accepting Medicaid o Must have "Meet & Greet" appointment at office prior to delivery . [facility] ([facility] o McCord, MD; Wallace, MD; Wood, MD o [address], [place], [place] [address] o [phone] o Mon-Wed 8:00-6:00, Thur-Fri 8:00-5:00, Sat 9:00-12:00 o Providers come to see babies at [facility] o Does NOT accept Medicaid o Only accepting siblings of current patients . [facility]  o [address], [place], [place]  [address] o [phone]   Fax - [phone] . [facility] o [address], , [place]  [address] o [phone]   Fax - [phone]  [place]  ([address] & [address]) . [facility] o Cirigliano, DO; Matthews, DO o [address] ,  [address] o [phone] o Mon-Fri 7:00-5:00 o Babies seen by [facility] providers o Does NOT accept Medicaid . [facility]   o Briscoe, MD; Howley, PA; Moreira, PA o [address], [place], [place] [address] o [phone] o Mon-Fri 8:00-5:00 o Babies seen by [facility] providers o Accepting Medicaid . [facility] o Boyd, MD; Church, PA; Jones, NP; Osborn, PA o [address], [place], [place] [address] o [phone] o Mon-Fri 8:00-5:00 o Babies seen by providers at [facility] o Accepting Medicaid  [place] ([address]) . [facility] [place] o Wendling, DO o [address] [place], [place] [address] o [phone] o Mon-Fri 8:00-5:00 o Babies seen by [facility] providers o Does NOT accept  Medicaid o Limited availability, please call early in hospitalization to schedule follow-up . [facility] o Calderon, PA; Cummings, MD; Dillard, MD; Martin, PA; Olson, MD; VanDeven, PA o [address], [place], [place] [address] o [phone] o Mon-Fri 8:30-5:00, Sat 9:00-12:00 o Babies seen by providers at [facility] o Accepting Medicaid o Please register online then schedule online or call office o [email or web address] . [facility] ([facility] o Hunter, NP; Kumar, MD; Martin Rogers, PA o [address], [place], [place] [address] o [phone] o Mon-Fri 8:00-5:00 o Babies seen by providers at [facility] o Accepting Medicaid . [facility] ([facility] o Delavan, MD; Kristi Fleenor, NP; West, MD o [address], [place], [place] [address] o [phone] o Mon-Fri 8:00-5:30, Sat&Sun by appointment (phones open at 8:30) o Babies seen by [facility] providers o Accepting Medicaid o Must be a first-time baby or sibling of current patient . [facility] [place]  o [address], [place], [place]  [address] o [phone]   Fax - [phone]  [place] ([address]) . [facility] o Brown, PA; Cowen, PA; Rice, MD; Helton, PA; Spry, MD o [address] [place], [place] [address] o [phone] o Mon-Thur 8:00-7:00, Fri 8:00-5:00, Sat 8:00-12:00, Sun 9:00-12:00 o Babies seen by [facility] providers o Accepting Medicaid . [facility] o Coe-Goins, MD; Marshall, MD; Pierre-Louis, MD o [address] [place] [address], [place], [place] [address] o [phone] o Mon-Thur 8:00-5:00 o Babies seen by providers at [facility] o Accepting Medicaid . [facility] o Bratton, MD; Coe-Goins, MD; Hayes,  MD; Lewis, MD; List, MD; Lott, MD; Marshall, MD; Moran, MD; O'Neal, MD; Pierre-Louis, MD; Pitonzo, MD; Scholer, MD; Spangle, MD o [address] [place], [place] [address] o [phone] o Mon-Fri 8:00-5:30, Sat (Oct.-Mar.) 9:00-1:00 o Babies seen by providers at [facility] o Accepting Medicaid o Must fill out new patient packet, available online at [email or web address] . [facility] ([facility] o Friddle, NP; Harris, NP; Kelly, NP; Logan, MD; Melvin, PA; Poth, MD; Ramadoss, MD; Stanton, NP o [address], [place], [place] [address] o [phone] o Mon-Thur 8:00-5:30, Fri 8:00-5:00 o Babies seen by providers at [facility] o Accepting Medicaid  [place] ([address]) . [facility] o Dixon, PA; Konawa, MD; Pickard, MD; Tapia, PA o [address], [place], [place] [address] o [phone] o Mon-Fri 8:00-5:00 o Babies seen by providers at [facility] o Accepting Medicaid   [place] ([address]) . [facility] [place] o Masneri, DO; Meyers, MD; Nelson, PA o [address], [place], [place] [address] o [phone] o Mon-Fri 8:00-5:00 o Babies seen by providers at [facility] o Does NOT accept Medicaid o Limited appointment availability, please call early in hospitalization  . [facility] [place] o Kunedd, DO; McGowen, MD o [address], [place], [place] [address] o [phone] o Mon-Fri 8:00-5:00 o Babies seen by [facility] providers o Does NOT accept Medicaid . [facility] [place] o Cameron, MD; MacDonald, MD; Michaels, PA; Nayak, MD o [address] [place] [address], [place], [place] [address] o [phone] o Mon-Fri 8:00-5:00 o After hours clinic ([address] [place], [place] [address]) [phone] Mon-Fri 5:00-8:00, Sat 12:00-6:00, Sun 10:00-4:00 o Babies seen by [facility] providers o  Accepting Medicaid . [facility] [place] o [address], [place], [place]  [address] o [phone]   Fax - [phone]  [place] ([address]) . [facility] [place] [facility] o Andy, MD o [address], [place], [place] [address] o [phone] o Mon-Fri 8:00-5:00 o Babies seen by [facility] providers o Does NOT accept Medicaid . [facility] [place] ([facility] [place]) o Eksir, MD o [address], [place], [place] [address] o [phone] o Mon-Thur 8:00-7:00, Fri 8:00-5:00, Sat 8:00-12:00 o Babies seen by providers at [facility] o Accepting Medicaid - but does not have vaccinations in office (must be received elsewhere) o Limited availability, please call early in hospitalization  [place] ([address]) . [facility]  o Charlene Flemming, MD o [address],  [place] [address] o [phone]  Fax [phone]  

## 2019-03-01 NOTE — Progress Notes (Signed)
I connected with  Emeline General on 03/01/19 at  3:15 PM EDT by telephone and verified that I am speaking with the correct person using two identifiers.   I discussed the limitations, risks, security and privacy concerns of performing an evaluation and management service by telephone and virtually and the availability of in person appointments. I also discussed with the patient that there may be a patient responsible charge related to this service. The patient expressed understanding and agreed to proceed. She is active in babyscripts  She has had 2 episodes of bleeding, none since last time she went to [facility] 7/820.  Linda,RN 03/01/2019  3:10 PM

## 2019-03-02 ENCOUNTER — Encounter: Payer: Self-pay | Admitting: Family Medicine

## 2019-03-11 NOTE — Telephone Encounter (Signed)
Opened in error

## 2019-03-29 ENCOUNTER — Other Ambulatory Visit: Payer: Medicaid Other

## 2019-03-29 ENCOUNTER — Encounter: Payer: Medicaid Other | Admitting: Certified Nurse Midwife

## 2019-04-01 ENCOUNTER — Encounter: Payer: Medicaid Other | Admitting: Obstetrics and Gynecology

## 2019-04-01 ENCOUNTER — Other Ambulatory Visit: Payer: Medicaid Other

## 2019-04-02 ENCOUNTER — Other Ambulatory Visit: Payer: Medicaid Other

## 2019-04-02 ENCOUNTER — Other Ambulatory Visit: Payer: Self-pay

## 2019-04-02 DIAGNOSIS — Z3490 Encounter for supervision of normal pregnancy, unspecified, unspecified trimester: Secondary | ICD-10-CM

## 2019-04-05 LAB — GLUCOSE TOLERANCE, 2 HOURS W/ 1HR
Glucose, 1 hour: 137 mg/dL (ref 65–179)
Glucose, 2 hour: 107 mg/dL (ref 65–152)
Glucose, Fasting: 69 mg/dL (ref 65–91)

## 2019-04-05 LAB — CBC
Hematocrit: 32.2 % — ABNORMAL LOW (ref 34.0–46.6)
Hemoglobin: 10.5 g/dL — ABNORMAL LOW (ref 11.1–15.9)
MCH: 30.3 pg (ref 26.6–33.0)
MCHC: 32.6 g/dL (ref 31.5–35.7)
MCV: 93 fL (ref 79–97)
Platelets: 171 10*3/uL (ref 150–450)
RBC: 3.47 x10E6/uL — ABNORMAL LOW (ref 3.77–5.28)
RDW: 13.1 % (ref 11.7–15.4)
WBC: 10.5 10*3/uL (ref 3.4–10.8)

## 2019-04-05 LAB — RPR, QUANT+TP ABS (REFLEX)
Rapid Plasma Reagin, Quant: 1:4 {titer} — ABNORMAL HIGH
T Pallidum Abs: NONREACTIVE

## 2019-04-05 LAB — HIV ANTIBODY (ROUTINE TESTING W REFLEX): HIV Screen 4th Generation wRfx: NONREACTIVE

## 2019-04-05 LAB — RPR: RPR Ser Ql: REACTIVE — AB

## 2019-04-06 ENCOUNTER — Ambulatory Visit (INDEPENDENT_AMBULATORY_CARE_PROVIDER_SITE_OTHER): Payer: Medicaid Other | Admitting: Nurse Practitioner

## 2019-04-06 ENCOUNTER — Encounter: Payer: Self-pay | Admitting: Nurse Practitioner

## 2019-04-06 ENCOUNTER — Other Ambulatory Visit: Payer: Self-pay

## 2019-04-06 VITALS — BP 120/72 | HR 73 | Temp 98.4°F | Wt 128.7 lb

## 2019-04-06 DIAGNOSIS — Z23 Encounter for immunization: Secondary | ICD-10-CM

## 2019-04-06 DIAGNOSIS — Z3A28 28 weeks gestation of pregnancy: Secondary | ICD-10-CM

## 2019-04-06 DIAGNOSIS — Z3490 Encounter for supervision of normal pregnancy, unspecified, unspecified trimester: Secondary | ICD-10-CM | POA: Diagnosis present

## 2019-04-06 DIAGNOSIS — Z3493 Encounter for supervision of normal pregnancy, unspecified, third trimester: Secondary | ICD-10-CM

## 2019-04-06 NOTE — Progress Notes (Signed)
    Subjective:  Katrina May is a 23 y.o. G2P0010 at [redacted]w[redacted]d being seen today for ongoing prenatal care.  She is currently monitored for the following issues for this low-risk pregnancy and has Supervision of low-risk pregnancy on their problem list.  Patient reports no complaints.  Contractions: Not present. Vag. Bleeding: None.  Movement: Present. Denies leaking of fluid.   The following portions of the patient's history were reviewed and updated as appropriate: allergies, current medications, past family history, past medical history, past social history, past surgical history and problem list. Problem list updated.  Objective:   Vitals:   04/06/19 1011  BP: 120/72  Pulse: 73  Temp: 98.4 F (36.9 C)  Weight: 128 lb 11.2 oz (58.4 kg)    Fetal Status: Fetal Heart Rate (bpm):  151 Fundal Height: 30 cm Movement: Present     General:  Alert, oriented and cooperative. Patient is in no acute distress.  Skin: Skin is warm and dry. No rash noted.   Cardiovascular: Normal heart rate noted  Respiratory: Normal respiratory effort, no problems with respiration noted  Abdomen: Soft, gravid, appropriate for gestational age. Pain/Pressure: Present     Pelvic:  Cervical exam deferred        Extremities: Normal range of motion.  Edema: None  Mental Status: Normal mood and affect. Normal behavior. Normal judgment and thought content.   Urinalysis:      Assessment and Plan:  Pregnancy: G2P0010 at [redacted]w[redacted]d  1. Encounter for supervision of low-risk pregnancy, antepartum Reminder to check BP weekly and add to baby scripts app - info through July reviewed in chart - normal BP Advised to sign up for Video classes for birth and breastfeeding  - Tdap vaccine greater than or equal to 7yo IM  Preterm labor symptoms and general obstetric precautions including but not limited to vaginal bleeding, contractions, leaking of fluid and fetal movement were reviewed in detail with the patient. Please refer to  After Visit Summary for other counseling recommendations.  Return in about 4 weeks (around 05/04/2019) for video visit.  Earlie Server, RN, MSN, NP-BC Nurse Practitioner, [facility] for [employer], [place] [facility] 04/06/2019 10:42 AM

## 2019-04-06 NOTE — Patient Instructions (Addendum)
AREA PEDIATRIC/FAMILY PRACTICE PHYSICIANS  [place] ([address]) . [facility] o Chambliss, MD; Eniola, MD; Hale, MD; Hensel, MD; McDiarmid, MD; McIntyer, MD; Neal, MD; Walden, MD o [address] [place], [place] [address] o [phone] o Mon-Fri 8:30-12:30, 1:30-5:00 o Providers come to see babies at [facility] o Accepting Medicaid . [facility] o Limited providers who accept newborns: Koirala, MD; Morrow, MD; Wolters, MD o [address], [place], [place] [address] o [phone] o Mon-Fri 8:00-5:30 o Babies seen by providers at [facility] o Does NOT accept Medicaid o Please call early in hospitalization for appointment (limited availability)  . [facility] o Mulberry, MD o [address] [place], [place] [address] o [phone] o Mon, Tue, Thur, Fri 8:30-5:00, Wed 10:00-7:00 (closed 1-2pm) o Babies seen by [facility] providers o Accepting Medicaid . [facility] - Pediatrician o Rubin, MD o [address], [place], [place] [address] o [phone] o Mon-Fri 8:30-5:00, Sat 8:30-12:00 o Provider comes to see babies at [facility] o Accepting Medicaid o Must have been referred from current patients or contacted office prior to delivery . [facility] ([facility]) o Brown, MD; Chandler, MD; Ettefagh, MD; Grant, MD; Lester, MD; McCormick, MD; McQueen, MD; Prose, MD; Simha, MD; Stanley, MD; Stryffeler, NP; Tebben, NP o [address], [place], [place] [address] o [phone] o Mon, Tue, Thur, Fri 8:30-5:30, Wed 9:30-5:30, Sat 8:30-12:30 o Babies seen by [facility] providers o Accepting Medicaid o Only accepting infants of first-time parents or siblings of current patients o Hospital discharge coordinator will make follow-up appointment . Jack Amos o [address],  [place], [place]  [address] o [phone]   Fax - [phone] . [facility] o [address], [place], [place]  [address] o Phone - [phone]   Fax - [phone] . Shilpa Gosrani o [address], [place], [place]  [address] o [phone]  [place] ([address]) . [facility] o Bates, MD; [place], MD; Cooper, Cox, MD; MD; Davis, MD; Dovico, MD; Ettefaugh, MD; Little, MD; Lowe, MD; Keiffer, MD; Melvin, MD; Sumner, MD; Williams, MD o [address], [place], [place] [address] o [phone] o Mon-Fri 8:30-5:00 (extended evenings Mon-Thur as needed), Sat-Sun 10:00-1:00 o Providers come to see babies at [facility] o Accepting Medicaid for families of first-time babies and families with all children in the household age 3 and under. Must register with office prior to making appointment (M-F only). . [facility] o Henson, NP; Knapp, MD; Lalonde, MD; Tysinger, PA o [address] [place], [place] [address] o [phone] o Mon-Fri 8:00-5:00 o Babies seen by providers at [facility] o Does NOT accept Medicaid/Commercial Insurance Only . [facility] [place] ([facility]  o Artis, MD; Barnes, MD; Bratton, MD; Coccaro, MD; Lockett Gardner, MD; Kramer, MD; Marshall, MD; Netherton, MD; Poleto, MD; Skinner, MD o [address] [place], [place] [address] o [phone] o Mon-Fri 8:30-5:30, Sat (Oct.-Mar.) 9:00-1:00 o Babies seen by providers at [facility] o Accepting Medicaid  [place] ([address]) . [facility] o Reid, MD; Warner, MD o [address], [place],  [address] o [phone] o Mon-Fri 8:30-5:00, Sat 8:30-12:00 o Providers come to see babies at [facility] o Does NOT accept Medicaid . [facility] o Becker, PA; Hagler, MD; Scifres, PA; Sun, MD; Swayne, MD o [address],  [place], [place] [address] o [phone] o Mon-Fri 8:00-5:00 o Babies seen by providers at [facility] o Does NOT accept Medicaid o Only accepting babies of parents who are patients o Please call early in hospitalization for appointment (limited availability) . [facility] o Clark, MD; Frye, MD; Kelleher, MD; Mack, NP; Miller, MD; O'Keller, MD; Patterson, NP; Pudlo, MD; Puzio, MD; Thomas, MD; Tucker, MD; Twiselton, MD o [address], [place], [place] [address] o [phone] o Mon-Fri 8:00-5:00, Sat 9:00-12:00 o Providers come to see babies at [facility] o Does NOT accept Medicaid  [place] ([address]) . [facility] o Limited providers accepting new patients: Brake, NP; Wharton, PA o [address], [place], [place] [address] o [phone] o Mon-Fri 8:00-5:00 o Babies seen by providers at [facility] o Does NOT accept Medicaid o Only accepting babies of parents who are patients o Please call early in hospitalization for appointment (limited availability) . [facility] o Gay, MD; Quinlan, MD o [address] [place], [place] [address] o [phone] (press 1 to schedule appointment) o Mon-Fri 8:00-5:00 o Providers come to see babies at [facility] o Does NOT accept Medicaid . [facility] o Mazer, MD o [address] [place], [place] [address] o [phone] o Mon-Fri 8:30-5:00 (lunch 12:30-1:00), extended hours by appointment only Wed 5:00-6:30 o Babies seen by [facility] providers o Accepting Medicaid . [facility] o Banks, MD; Jordan, MD; Koberlein, MD o [address], [place], [place] [address] o [phone] o Mon-Fri 8:00-5:00 o Babies seen by [facility] providers o Does NOT accept Medicaid . [facility] o Parker, MD; Hunter, MD; Wallace, DO o [address] [place], [place]  [address] o [phone] o Mon-Fri 8:00-5:00 o Babies seen by [facility] providers o Does NOT accept Medicaid . [facility] o Brandon, PA; Brecken, PA; Christy, NP; Dees, MD; DeClaire, MD; DeWeese, MD; Hansen, NP; Mills, NP; Parrish, NP; Smoot, NP; Summer, MD; Vapne, MD o [address] [place], [place] [address] o [phone] o Mon-Fri 8:30-5:00, Sat 10:00-1:00 o Providers come to see babies at [facility] o Does NOT accept Medicaid o Free prenatal information session Tuesdays at 4:45pm . [facility] o Bouska, MD; Gordon, PA; Jeffery, PA; Weber, PA o [address] [place] [address] o [phone] o Mon-Fri 7:30-5:30 o Babies seen by [facility] providers . [facility] o [address], [place], [place], [place]  [address] o [phone]   Fax - [phone]  [place] ([phone]) . [facility] o Reese, MD o [address] [place], [place] [address] o [phone] o Mon-Thur 8:00-6:00 o Providers come to see babies at [facility] o Accepting Medicaid . [facility] o Anderson, NP; Badger, MD; Beal, PA; Spencer, PA o [address] [place],  [address] o [phone] o Mon-Thur 7:30-7:30, Fri 7:30-4:30 o Babies seen by [facility] providers o Accepting Medicaid . [facility] o Agbuya, MD; Klett, NP; Romgoolam, MD o [address], ,  [address] o [phone] o Mon-Fri 8:30-5:00, Sat 8:30-12:00 o Providers come to see babies at [facility] o Accepting Medicaid o Must have "Meet & Greet" appointment at office prior to delivery . [facility]  ([facility] ) o McCord,   MD; Juleen China, MD; Clydene Laming, [place] [address], [place], [place] [address] o [phone] o Mon-Wed 8:00-6:00, Thur-Fri 8:00-5:00, Sat 9:00-12:00 o Providers come to  see babies at [facility] o Does NOT accept Medicaid o Only accepting siblings of current patients . [facility] [place], [place], [place], [place]  [address] o [phone]   Fax [phone] . [facility] [address], [place], [place]  [address] o [phone]   Fax - Morton Gorman [phone] & [phone]) . Therapist, music at [place], DO; [place], [address] [place], [place] [address] o [phone] o Mon-Fri 7:00-5:00 o Babies seen by [facility] providers o Does NOT accept Medicaid . [place], MD; [place], [place]; [place], [place], [place], [place] [address] o [phone] o Mon-Fri 8:00-5:00 o Babies seen by [facility] providers o Accepting Medicaid . Lamont, MD; [place], [place]; [place], NP; [place], [place], [place], [place] [address] o [phone] o Mon-Fri 8:00-5:00 o Babies seen by providers at [facility] [place] [phone]) . [facility] [place], [place] o [address] [place], [place] [address] o [phone] o Mon-Fri 8:00-5:00 o Babies seen by [facility] providers o Does NOT accept Medicaid o Limited availability, please call early in hospitalization to schedule follow-up . [facility] Leilani Merl, PA; Maisie Fus, MD; [place], MD; [place], [place]; Jeannine Kitten, MD; [place], [place] [facility] [address], [place], [place] [address] o [phone] o Mon-Fri 8:30-5:00, Sat 9:00-12:00 o Babies seen by providers at [facility] o Accepting Medicaid o Please register online then schedule online or call office o [email or web address] . [place] [facility]  [place]) Kristian Covey, NP; Dwyane Dee, MD; Leonidas Romberg, PA o [address] [place], [place], [place] [address] o [phone] o Mon-Fri 8:00-5:00 o Babies seen by providers at [facility] o Accepting Medicaid . [place] ([facility]) Dairl Ponder, MD; Rayvon Char, NP; Melina Modena, MD o [address] [place], [place], [place] [address] o [phone] o Mon-Fri 8:00-5:30, Sat&Sun by appointment (phones open at 8:30) o Babies seen by [facility] providers o Accepting Medicaid o Must be a first-time baby or sibling of current patient . [place], [address], [place], [place]  [address] o [phone]   Fax - [phone]  [place] [phone] & [phone]) . [place], [place]; [place], [place]; Benjamine Mola, MD; [place], [place]; Harrell Lark, MD o [address] [place], [place] [address] o [phone] o Mon-Thur 8:00-7:00, Fri 8:00-5:00, Sat 8:00-12:00, Sun 9:00-12:00 o Babies seen by [facility] providers o Accepting Medicaid . [facility] at [facility], MD; Ruthann Cancer, MD; [facility], MD o [address], [place], [place] [address] o [phone] o Mon-Thur 8:00-5:00 o Babies seen by providers at [facility] o Accepting Medicaid . [facility] at [place], MD; Co[place], MD; Amedeo Plenty, MD; Bobby Rumpf, MD; List, MD; Lavonia Drafts, MD; Ruthann Cancer, MD; Selinda Eon, MD; Audie Box, MD; Jim Like, MD; Christie Nottingham, MD; Hubbard Hartshorn, MD; Modena Nunnery, MD o [address] [place], [place]  [address] o [phone] o Mon-Fri 8:00-5:30, Sat (Oct.-Mar.) 9:00-1:00 o Babies seen by providers at [facility] o Accepting Medicaid o Must fill out new patient packet, available online at [email or web address] . [facility] [place] [facility]) Simone Curia, NP; Tiburcio Pea, NP; Tresa Endo, NP; Whitney Post, MD;  [place], [place]; Hennie Duos, MD; Wynne Dust, MD; Kavin Leech, NP o [address], [place], [place] [address] o [phone] o Mon-Thur 8:00-5:30, Fri 8:00-5:00 o Babies seen by providers at [facility] o Accepting [facility] [phone]) . [facility] o [place], [place]; [place], MD; Tanya Nones, MD; [place], [place] o [address] [place], [place] [address] o [phone] o Mon-Fri 8:00-5:00 o Babies seen by providers at [facility] o Accepting [facility] [phone]) . [facility] at [facility] o [place], DO; Lenise Arena, MD; [place], [place] o [address], [place], [place] [address] o [phone] o Mon-Fri 8:00-5:00 o Babies seen by providers at [facility] o Does NOT accept Medicaid o Limited appointment availability, please call early in hospitalization  . Nature conservation officer at [facility] o Whitten, DO; [place], MD o [address], [place], [place] [address] o [phone] o Mon-Fri 8:00-5:00 o Babies seen by [facility] providers o Does NOT accept Medicaid . [facility] Lorrine Kin, MD; Ninetta Lights, MD; [place], [place]; Humbird, MD o [address], [place], [place] [address] o [phone] o Mon-Fri 8:00-5:00 o After hours clinic [place] [place], [place] [address]) [phone] Mon-Fri 5:00-8:00, Sat 12:00-6:00, Sun 10:00-4:00 o Babies seen by [facility] providers o Accepting Medicaid . [facility] at [facility] o [address], [place], [place]  [address] o [phone]   Fax - [phone]  [place] [phone]) . Nature conservation officer at [facility], MD o [address] [place], [place], [place] [address] o [phone] o Mon-Fri 8:00-5:00 o Babies seen by [facility] providers o Does NOT accept Medicaid . [facility] [place] [facility] [place]) Tomi Likens, MD o [address] [place] [address] [place], [place]  [address] o [phone] o Mon-Thur 8:00-7:00, Fri 8:00-5:00, Sat 8:00-12:00 o Babies seen by providers at [facility] o Accepting Medicaid - but does not have vaccinations in office (must be received elsewhere) o Limited availability, please call early in hospitalization  [place] ([address]) . [facility]  o Wyvonne Lenz, MD o [address] [place] [address] o [phone]  Fax [phone]   Third Trimester of Pregnancy  The third trimester is from week 28 through week 40 (months 7 through 9). This trimester is when your unborn baby (fetus) is growing very fast. At the end of the ninth month, the unborn baby is about 20 inches in length. It weighs about 6-10 pounds. Follow these instructions at home: Medicines  Take over-the-counter and prescription medicines only as told by your doctor. Some medicines are safe and some medicines are not safe during pregnancy.  Take a prenatal vitamin that contains at least 600 micrograms (mcg) of folic acid.  If you have trouble pooping (constipation), take medicine that will make your stool soft (stool softener) if your doctor approves. Eating and drinking   Eat regular, healthy meals.  Avoid raw meat and uncooked cheese.  If you get low calcium from the food you eat, talk to your doctor about taking a daily calcium supplement.  Eat four or five small  meals rather than three large meals a day.  Avoid foods that are high in fat and sugars, such as fried and sweet foods.  To prevent constipation: ? Eat foods that are high in fiber, like fresh fruits and vegetables, whole grains, and beans. ? Drink enough fluids to keep your pee (urine) clear or pale yellow. Activity  Exercise only as told by your doctor. Stop exercising if you start to have cramps.  Avoid heavy lifting, wear low heels, and sit up straight.  Do not exercise if it is too hot, too humid, or if you are in a place of great height (high  altitude).  You may continue to have sex unless your doctor tells you not to. Relieving pain and discomfort  Wear a good support bra if your breasts are tender.  Take frequent breaks and rest with your legs raised if you have leg cramps or low back pain.  Take warm water baths (sitz baths) to soothe pain or discomfort caused by hemorrhoids. Use hemorrhoid cream if your doctor approves.  If you develop puffy, bulging veins (varicose veins) in your legs: ? Wear support hose or compression stockings as told by your doctor. ? Raise (elevate) your feet for 15 minutes, 3-4 times a day. ? Limit salt in your food. Safety  Wear your seat belt when driving.  Make a list of emergency phone numbers, including numbers for family, friends, the hospital, and police and fire departments. Preparing for your baby's arrival To prepare for the arrival of your baby:  Take prenatal classes.  Practice driving to the hospital.  Visit the hospital and tour the maternity area.  Talk to your work about taking leave once the baby comes.  Pack your hospital bag.  Prepare the baby's room.  Go to your doctor visits.  Buy a rear-facing car seat. Learn how to install it in your car. General instructions  Do not use hot tubs, steam rooms, or saunas.  Do not use any products that contain nicotine or tobacco, such as cigarettes and e-cigarettes. If you need help quitting, ask your doctor.  Do not drink alcohol.  Do not douche or use tampons or scented sanitary pads.  Do not cross your legs for long periods of time.  Do not travel for long distances unless you must. Only do so if your doctor says it is okay.  Visit your dentist if you have not gone during your pregnancy. Use a soft toothbrush to brush your teeth. Be gentle when you floss.  Avoid cat litter boxes and soil used by cats. These carry germs that can cause birth defects in the baby and can cause a loss of your baby (miscarriage) or  stillbirth.  Keep all your prenatal visits as told by your doctor. This is important. Contact a doctor if:  You are not sure if you are in labor or if your water has broken.  You are dizzy.  You have mild cramps or pressure in your lower belly.  You have a nagging pain in your belly area.  You continue to feel sick to your stomach, you throw up, or you have watery poop.  You have bad smelling fluid coming from your vagina.  You have pain when you pee. Get help right away if:  You have a fever.  You are leaking fluid from your vagina.  You are spotting or bleeding from your vagina.  You have severe belly cramps or pain.  You lose or gain weight quickly.  You have trouble catching your breath and have chest pain.  You notice sudden or extreme puffiness (swelling) of your face, hands, ankles, feet, or legs.  You have not felt the baby move in over an hour.  You have severe headaches that do not go away with medicine.  You have trouble seeing.  You are leaking, or you are having a gush of fluid, from your vagina before you are 37 weeks.  You have regular belly spasms (contractions) before you are 37 weeks. Summary  The third trimester is from week 28 through week 40 (months 7 through 9). This time is when your unborn baby is growing very fast.  Follow your doctor's advice about medicine, food, and activity.  Get ready for the arrival of your baby by taking prenatal classes, getting all the baby items ready, preparing the baby's room, and visiting your doctor to be checked.  Get help right away if you are bleeding from your vagina, or you have chest pain and trouble catching your breath, or if you have not felt your baby move in over an hour. This information is not intended to replace advice given to you by your health care provider. Make sure you discuss any questions you have with your health care provider. Document Released: 10/30/2009 Document Revised: 11/26/2018  Document Reviewed: 09/10/2016 Elsevier Patient Education  2020 [employer]

## 2019-05-04 ENCOUNTER — Telehealth: Payer: Self-pay | Admitting: Nurse Practitioner

## 2019-05-04 ENCOUNTER — Other Ambulatory Visit: Payer: Self-pay

## 2019-05-04 ENCOUNTER — Telehealth: Payer: Self-pay | Admitting: Medical

## 2019-05-04 ENCOUNTER — Encounter: Payer: Medicaid Other | Admitting: Medical

## 2019-05-04 ENCOUNTER — Encounter: Payer: Self-pay | Admitting: Medical

## 2019-05-04 NOTE — Telephone Encounter (Signed)
Attempted to call patient about her missed OB appointment on 9/15. No answer, left voicemail for patient to give the office a call back to be rescheduled. No show letter mailed

## 2019-05-04 NOTE — Patient Instructions (Signed)
Please reschedule your missed appointment from today as soon as possible

## 2019-05-04 NOTE — Progress Notes (Signed)
10:24a- Called pt for My Chart visit, no answer, left VM that will call back in 10 to 15 mins.    10:35A- 2ND attempt, still no answer, left VM for her to be rescheduled.

## 2019-05-05 ENCOUNTER — Telehealth: Payer: Self-pay | Admitting: Obstetrics & Gynecology

## 2019-05-05 ENCOUNTER — Telehealth (INDEPENDENT_AMBULATORY_CARE_PROVIDER_SITE_OTHER): Payer: Medicaid Other | Admitting: Medical

## 2019-05-05 ENCOUNTER — Encounter: Payer: Self-pay | Admitting: Medical

## 2019-05-05 ENCOUNTER — Other Ambulatory Visit: Payer: Self-pay

## 2019-05-05 DIAGNOSIS — O26893 Other specified pregnancy related conditions, third trimester: Secondary | ICD-10-CM

## 2019-05-05 DIAGNOSIS — Z3490 Encounter for supervision of normal pregnancy, unspecified, unspecified trimester: Secondary | ICD-10-CM

## 2019-05-05 DIAGNOSIS — O26899 Other specified pregnancy related conditions, unspecified trimester: Secondary | ICD-10-CM

## 2019-05-05 DIAGNOSIS — Z3A32 32 weeks gestation of pregnancy: Secondary | ICD-10-CM

## 2019-05-05 DIAGNOSIS — R102 Pelvic and perineal pain: Secondary | ICD-10-CM

## 2019-05-05 NOTE — Telephone Encounter (Signed)
Opened in error

## 2019-05-05 NOTE — Progress Notes (Signed)
I connected with Emeline General on 05/05/19 at  2:55 PM EDT by: MyChart and verified that I am speaking with the correct person using two identifiers.  Patient is located at [place] and provider is located at [facility].     The purpose of this virtual visit is to provide medical care while limiting exposure to the novel coronavirus. I discussed the limitations, risks, security and privacy concerns of performing an evaluation and management service by MyChart and the availability of in person appointments. I also discussed with the patient that there may be a patient responsible charge related to this service. By engaging in this virtual visit, you consent to the provision of healthcare.  Additionally, you authorize for your insurance to be billed for the services provided during this visit.  The patient expressed understanding and agreed to proceed.  The following staff members participated in the virtual visit:  Carver Fila, [place] VISIT NOTE  Subjective:  Katrina May is a 23 y.o. G2P0010 at [redacted]w[redacted]d  for phone visit for ongoing prenatal care.  She is currently monitored for the following issues for this low-risk pregnancy and has Supervision of low-risk pregnancy on their problem list.  Patient reports round ligament pain.  Contractions: Not present. Vag. Bleeding: None.  Movement: Present. Denies leaking of fluid.   The following portions of the patient's history were reviewed and updated as appropriate: allergies, current medications, past family history, past medical history, past social history, past surgical history and problem list.   Objective:  There were no vitals filed for this visit. Self-Obtained  Fetal Status:     Movement: Present     Assessment and Plan:  Pregnancy: G2P0010 at [redacted]w[redacted]d 1. Encounter for supervision of low-risk pregnancy, antepartum - Doing well   2. Pain of round ligament affecting pregnancy, antepartum - Continues to have some pain when laying down at  night - Discussed Tylenol PRN for pain - Ice or Heat   Preterm labor symptoms and general obstetric precautions including but not limited to vaginal bleeding, contractions, leaking of fluid and fetal movement were reviewed in detail with the patient.  Return in about 4 weeks (around 06/02/2019) for LOB, In-Person.  No future appointments.   Time spent on virtual visit: 10 minutes  Kerry Hough, PA-C

## 2019-05-05 NOTE — Progress Notes (Signed)
3:10p- called pt for My Chart visit, no answer, will retry in 10 min. Left VM.   3:34p- Pt answered  Pt states a lot of aching when she's lying in Bed.

## 2019-05-05 NOTE — Patient Instructions (Addendum)
Fetal Movement Counts °Patient Name: ________________________________________________ Patient Due Date: ____________________ °What is a fetal movement count? ° °A fetal movement count is the number of times that you feel your baby move during a certain amount of time. This may also be called a fetal kick count. A fetal movement count is recommended for every pregnant woman. You may be asked to start counting fetal movements as early as week 28 of your pregnancy. °Pay attention to when your baby is most active. You may notice your baby's sleep and wake cycles. You may also notice things that make your baby move more. You should do a fetal movement count: °· When your baby is normally most active. °· At the same time each day. °A good time to count movements is while you are resting, after having something to eat and drink. °How do I count fetal movements? °1. Find a quiet, comfortable area. Sit, or lie down on your side. °2. Write down the date, the start time and stop time, and the number of movements that you felt between those two times. Take this information with you to your health care visits. °3. For 2 hours, count kicks, flutters, swishes, rolls, and jabs. You should feel at least 10 movements during 2 hours. °4. You may stop counting after you have felt 10 movements. °5. If you do not feel 10 movements in 2 hours, have something to eat and drink. Then, keep resting and counting for 1 hour. If you feel at least 4 movements during that hour, you may stop counting. °Contact a health care provider if: °· You feel fewer than 4 movements in 2 hours. °· Your baby is not moving like he or she usually does. °Date: ____________ Start time: ____________ Stop time: ____________ Movements: ____________ °Date: ____________ Start time: ____________ Stop time: ____________ Movements: ____________ °Date: ____________ Start time: ____________ Stop time: ____________ Movements: ____________ °Date: ____________ Start time:  ____________ Stop time: ____________ Movements: ____________ °Date: ____________ Start time: ____________ Stop time: ____________ Movements: ____________ °Date: ____________ Start time: ____________ Stop time: ____________ Movements: ____________ °Date: ____________ Start time: ____________ Stop time: ____________ Movements: ____________ °Date: ____________ Start time: ____________ Stop time: ____________ Movements: ____________ °Date: ____________ Start time: ____________ Stop time: ____________ Movements: ____________ °This information is not intended to replace advice given to you by your health care provider. Make sure you discuss any questions you have with your health care provider. °Document Released: 09/04/2006 Document Revised: 08/25/2018 Document Reviewed: 09/14/2015 °Elsevier Patient Education © 2020 Elsevier Inc. °Braxton Hicks Contractions °Contractions of the uterus can occur throughout pregnancy, but they are not always a sign that you are in labor. You may have practice contractions called Braxton Hicks contractions. These false labor contractions are sometimes confused with true labor. °What are Braxton Hicks contractions? °Braxton Hicks contractions are tightening movements that occur in the muscles of the uterus before labor. Unlike true labor contractions, these contractions do not result in opening (dilation) and thinning of the cervix. Toward the end of pregnancy (32-34 weeks), Braxton Hicks contractions can happen more often and may become stronger. These contractions are sometimes difficult to tell apart from true labor because they can be very uncomfortable. You should not feel embarrassed if you go to the hospital with false labor. °Sometimes, the only way to tell if you are in true labor is for your health care provider to look for changes in the cervix. The health care provider will do a physical exam and may monitor your contractions. If you   are not in true labor, the exam should show  that your cervix is not dilating and your water has not broken. °If there are no other health problems associated with your pregnancy, it is completely safe for you to be sent home with false labor. You may continue to have Braxton Hicks contractions until you go into true labor. °How to tell the difference between true labor and false labor °True labor °· Contractions last 30-70 seconds. °· Contractions become very regular. °· Discomfort is usually felt in the top of the uterus, and it spreads to the lower abdomen and low back. °· Contractions do not go away with walking. °· Contractions usually become more intense and increase in frequency. °· The cervix dilates and gets thinner. °False labor °· Contractions are usually shorter and not as strong as true labor contractions. °· Contractions are usually irregular. °· Contractions are often felt in the front of the lower abdomen and in the groin. °· Contractions may go away when you walk around or change positions while lying down. °· Contractions get weaker and are shorter-lasting as time goes on. °· The cervix usually does not dilate or become thin. °Follow these instructions at home: ° °· Take over-the-counter and prescription medicines only as told by your health care provider. °· Keep up with your usual exercises and follow other instructions from your health care provider. °· Eat and drink lightly if you think you are going into labor. °· If Braxton Hicks contractions are making you uncomfortable: °? Change your position from lying down or resting to walking, or change from walking to resting. °? Sit and rest in a tub of warm water. °? Drink enough fluid to keep your urine pale yellow. Dehydration may cause these contractions. °? Do slow and deep breathing several times an hour. °· Keep all follow-up prenatal visits as told by your health care provider. This is important. °Contact a health care provider if: °· You have a fever. °· You have continuous pain in  your abdomen. °Get help right away if: °· Your contractions become stronger, more regular, and closer together. °· You have fluid leaking or gushing from your vagina. °· You pass blood-tinged mucus (bloody show). °· You have bleeding from your vagina. °· You have low back pain that you never had before. °· You feel your baby’s head pushing down and causing pelvic pressure. °· Your baby is not moving inside you as much as it used to. °Summary °· Contractions that occur before labor are called Braxton Hicks contractions, false labor, or practice contractions. °· Braxton Hicks contractions are usually shorter, weaker, farther apart, and less regular than true labor contractions. True labor contractions usually become progressively stronger and regular, and they become more frequent. °· Manage discomfort from Braxton Hicks contractions by changing position, resting in a warm bath, drinking plenty of water, or practicing deep breathing. °This information is not intended to replace advice given to you by your health care provider. Make sure you discuss any questions you have with your health care provider. °Document Released: 12/19/2016 Document Revised: 07/18/2017 Document Reviewed: 12/19/2016 °Elsevier Patient Education © 2020 Elsevier Inc. ° °AREA PEDIATRIC/FAMILY PRACTICE PHYSICIANS ° °[place] ([address]) °• [facility] °o Chambliss, MD; Eniola, MD; Hale, MD; Hensel, MD; McDiarmid, MD; McIntyer, MD; Neal, MD; Walden, MD °o [address] [place], [place] [address] °o [phone] °o Mon-Fri 8:30-12:30, 1:30-5:00 °o Providers come to see babies at [facility] °o Accepting Medicaid °• [facility] °o Limited providers who accept newborns: Koirala, MD; Morrow, MD; Wolters, MD °o [address], [place], [place] [address] °o [phone] °o Mon-Fri 8:00-5:30 °o Babies seen by providers at [facility] °o Does NOT accept  Medicaid °o Please call early in hospitalization for appointment (limited availability)  °• [facility] °o Mulberry, MD °o [address] [place], [place] [address] °o [phone] °o Mon, Tue, Thur, Fri 8:30-5:00, Wed 10:00-7:00 (closed 1-2pm) °o Babies seen by [facility] providers °o Accepting Medicaid °• [facility] °o Rubin, MD °o [address], [place], [place] [address] °o [phone] °o Mon-Fri 8:30-5:00, Sat 8:30-12:00 °o Provider comes to see babies at [facility] °o Accepting Medicaid °o Must have been referred from current patients or contacted office prior to delivery °• [facility] ([facility]) °o Brown, MD; Chandler, MD; Ettefagh, MD; Grant, MD; Lester, MD; McCormick, MD; McQueen, MD; Prose, MD; Simha, MD; Stanley, MD; Stryffeler, NP; Tebben, NP °o [address], [place], [place] [address] °o [phone] °o Mon, Tue, Thur, Fri 8:30-5:30, Wed 9:30-5:30, Sat 8:30-12:30 °o Babies seen by [facility] providers °o Accepting Medicaid °o Only accepting infants of first-time parents or siblings of current patients °o Hospital discharge coordinator will make follow-up appointment °• Jack Amos °o [address], [place], [place]  [address] °o [phone]   Fax - [phone] °• [facility] °o [address], [place], [place]  [address] °o Phone - [phone]   Fax - [phone] °• Shilpa Gosrani °o [address], [place], [place]  [address] °o [phone] ° °[place] ([address]) °•  [facility] °o Bates, MD; Brassfield, MD; Cooper, Cox, MD; MD; Davis, MD; Dovico, MD; Ettefaugh, MD; Little, MD; Lowe, MD; Keiffer, MD; Melvin, MD; Sumner, MD; Williams, MD °o [address], [place], [place] [address] °o [phone] °o Mon-Fri 8:30-5:00 (extended evenings Mon-Thur as needed), Sat-Sun 10:00-1:00 °o Providers come to see babies at [facility] °o Accepting Medicaid for families of first-time babies and families with all children in the household age 3 and under. Must register with office prior to making appointment (M-F only). °• [facility] °o Henson, NP; Knapp, MD; Lalonde, MD; Tysinger, PA °o [address] [place], [place] [address] °o [phone] °o Mon-Fri 8:00-5:00 °o Babies seen by providers at [facility] °o Does NOT accept Medicaid/Commercial Insurance Only °• [facility] ([facility]  °o Artis, MD; Barnes, MD; Bratton, MD; Coccaro, MD; Lockett Gardner, MD; Kramer, MD; Marshall, MD; Netherton, MD; Poleto, MD; Skinner, MD °o [address] [place], [place] [address] °o [phone] °o Mon-Fri 8:30-5:30, Sat (Oct.-Mar.) 9:00-1:00 °o Babies seen by providers at [facility] °o Accepting Medicaid ° °[place] ([address]) °• [facility] °o Reid, MD; Warner, MD °o [address], [place], [place] [address] °o [phone] °o Mon-Fri 8:30-5:00, Sat 8:30-12:00 °o Providers come to see babies at [facility] °o Does NOT accept Medicaid °• [facility] °o Becker, PA; Hagler, MD; Scifres, PA; Sun, MD; Swayne, MD °o [address], [place],  [address] °o [phone] °o Mon-Fri 8:00-5:00 °o Babies seen by providers at [facility] °o Does NOT accept Medicaid °o Only accepting babies of parents who are patients °o Please call early in hospitalization for appointment (limited availability) °• [facility] °o Clark, MD; Frye, MD; Kelleher, MD; Mack, NP; Miller, MD; O'Keller, MD; [place], NP; Pudlo, MD; Puzio, MD; Thomas, MD; Tucker, MD; Twiselton, MD °o [address], [place], [place] [address] °o [phone] °o Mon-Fri 8:00-5:00, Sat 9:00-12:00 °o Providers come to see babies at [facility] °o Does NOT accept Medicaid ° °[place] ([address]) °• [facility] °o Limited providers accepting new patients: Brake, NP; Wharton, PA °o [address], [place], [place] [address] °o [phone] °o Mon-Fri 8:00-5:00 °o Babies seen by providers at [facility] °o Does NOT accept Medicaid °o Only accepting babies of parents who are patients °o Please call early in hospitalization for appointment (limited availability) °• [facility] °o Gay, MD; Quinlan, MD °o [address] [place], [place] [address] °o [phone] (press 1 to schedule appointment) °o Mon-Fri 8:00-5:00 °o Providers come to see babies at [facility] °o Does NOT accept Medicaid °• [facility] °o Mazer, MD °o [address] [place], [place] [address] °o [phone] °o Mon-Fri 8:30-5:00 (lunch 12:30-1:00), extended hours by appointment only Wed 5:00-6:30 °o Babies seen by [facility] providers °o Accepting Medicaid °• [facility] °o Banks, MD; Jordan, MD; Koberlein, MD °o [address], [place], [place] [address] °o [phone] °o Mon-Fri 8:00-5:00 °o Babies seen by [facility] providers °o Does NOT accept Medicaid °• [facility] °o Parker, MD; Hunter, MD; Wallace, DO °o [address] [place], [place] [address] °o [phone] °o Mon-Fri 8:00-5:00 °o Babies seen by [facility] providers °o Does NOT accept Medicaid °• [facility] °o Brandon, PA; Brecken, PA; Christy, NP; Dees, MD; DeClaire, MD; DeWeese, MD; Hansen, NP; Mills, NP; Parrish, NP; Smoot, NP; Summer, MD; Vapne, MD °o [address] [place], [place] [address] °o [phone] °o Mon-Fri 8:30-5:00, Sat 10:00-1:00 °o Providers come to see babies at [facility] °o Does NOT accept Medicaid °o Free prenatal information session Tuesdays at 4:45pm °• [facility] °o Bouska, MD; Gordon, PA; Jeffery, PA; Weber, PA °o [address] [place] [address] °o [phone] °o Mon-Fri  7:30-5:30 °o Babies seen by [facility] providers °• [facility] °o [address], [place], [place]  [address] °o [phone]   Fax - [phone] ° °[place] ([address]) °• [facility] °o Reese, MD °o [address] [place], [place] [address] °o [phone] °o Mon-Thur 8:00-6:00 °o Providers come to see babies at [facility] °o Accepting Medicaid °• [facility] °o Anderson, NP; Badger, MD; Beal, PA; Spencer, PA °o [address] [place], [place] [address] °o [phone] °o Mon-Thur 7:30-7:30, Fri 7:30-4:30 °o Babies seen by [facility] providers °o Accepting Medicaid °• [facility] °o Agbuya, MD; Klett, NP; Romgoolam, MD °o [address], [place],  [address] °o [phone] °o Mon-Fri 8:30-5:00, Sat 8:30-12:00 °o Providers come to see babies at [facility] °o Accepting Medicaid °o Must have “Meet & Greet” appointment at office prior to delivery °• [facility] ([facility]) °o McCord, MD; Wallace, MD; Wood, MD °o [address], [place],  [address] °o [phone] °o Mon-Wed 8:00-6:00, Thur-Fri 8:00-5:00, Sat 9:00-12:00 °o Providers come to see babies at [facility] °o Does NOT accept Medicaid °o Only accepting siblings of current patients °• [facility]  °o [address], [place],   [place]  [address] °o [phone]   Fax - [phone] °• [facility] °o [address], [place], [place]  [address] °o [phone]   Fax - [phone] ° °[place] ([address]) °• [facility] [facility] °o Cirigliano, DO; Matthews, DO °o [address] [place], [place] [address] °o [phone] °o Mon-Fri 7:00-5:00 °o Babies seen by [facility] providers °o Does NOT accept Medicaid °• [facility] °o Briscoe, MD; Howley, PA; Moreira, PA °o [address], [place], [place] [address] °o [phone] °o Mon-Fri 8:00-5:00 °o Babies seen by [facility] providers °o Accepting Medicaid °• [facility] °o Boyd, MD; Church, PA; Jones, NP; Osborn, PA °o [address], [place], [place] [address] °o [phone] °o Mon-Fri 8:00-5:00 °o Babies seen by providers at [facility] °o Accepting Medicaid ° °[place] ([address]) °• [facility] [place] °o Wendling, DO °o [address] [place], [place] [address] °o [phone] °o Mon-Fri 8:00-5:00 °o Babies seen by [facility] providers °o Does NOT accept Medicaid °o Limited availability, please call early in hospitalization to schedule follow-up °• [facility] °o Calderon, PA; Cummings, MD; Dillard, MD; Martin, PA; Olson, MD; VanDeven, PA °o [address], [place], [place] [address] °o [phone] °o Mon-Fri 8:30-5:00, Sat 9:00-12:00 °o Babies seen by providers at [facility] °o Accepting Medicaid °o Please register online then schedule online or call office °o [email or web address] °• [facility] °o Hunter, NP; Kumar, MD; Martin Rogers, PA °o [address], [place], [place] [address] °o [phone] °o Mon-Fri 8:00-5:00 °o Babies seen by providers at [facility] °o Accepting Medicaid °• [facility] ([facility] °o [place], MD; Kristi Fleenor, NP; West, MD °o [address], [place], [place] [address] °o [phone] °o Mon-Fri 8:00-5:30, Sat&Sun by appointment (phones open at 8:30) °o Babies seen by [facility] providers °o Accepting Medicaid °o Must be a first-time baby or sibling of current patient °• [facility] [place]  °o [address], [place], [place]   [address] °o [phone]   Fax - [phone] ° °[place] ([address]) °• [facility] °o Brown, PA; Cowen, PA; Rice, MD; Helton, PA; Spry, MD °o [address] [place], [place] [address] °o [phone] °o Mon-Thur 8:00-7:00, Fri 8:00-5:00, Sat 8:00-12:00, Sun 9:00-12:00 °o Babies seen by [facility] providers °o Accepting Medicaid °• [facility] [place] °o Coe-Goins, MD; Marshall, MD; Pierre-Louis, MD °o [address] [place] [address], [place], [place] [address] °o [phone] °o Mon-Thur 8:00-5:00 °o Babies seen by providers at [facility] °o Accepting Medicaid °• [facility] °o Bratton, MD; Coe-Goins, MD; Hayes, MD; Lewis, MD; List, MD; Lott, MD; Marshall, MD; Moran, MD; O'Neal, MD; Pierre-Louis, MD; Pitonzo, MD; Scholer, MD; Spangle, MD °o [address] [place],  [address] °o [phone] °o Mon-Fri 8:00-5:30, Sat (Oct.-Mar.) 9:00-1:00 °o Babies seen by providers at [facility] °o Accepting Medicaid °o Must fill out new patient packet, available online at [email or web address] °• [facility] ([facility]) °o Friddle, NP; Harris, NP; Kelly, NP; [place], MD; Melvin, PA; Poth, MD; Ramadoss, MD; Stanton,   NP o [address], [place], [place] [address] o [phone] o Mon-Thur 8:00-5:30, Fri 8:00-5:00 o Babies seen by providers at [place] [phone])  [place], [place]; Buchanan, MD; [place], MD; [place], [place] o [address] [place], [place] [address] o [phone] o Mon-Fri 8:00-5:00 o Babies seen by providers at [place] [phone])  [facility] [place], [place]; Olen Pel, MD; [place], [place], [place], [place] [address] o [phone] o Mon-Fri 8:00-5:00 o Babies  seen by providers at [facility] o Does NOT accept Medicaid o Limited appointment availability, please call early in hospitalization   [place] [facility] [place], [place]; [place], [address] [place], [place] [address] o [phone] o Mon-Fri 8:00-5:00 o Babies seen by [facility] providers o Does NOT accept [facility] Su Grand, MD; Guy Sandifer, MD; [place], [place]; [place], MD o [address], [place], [place] [address] o [phone] o Mon-Fri 8:00-5:00 o After hours clinic [facility] [place], [place] [address]) [phone] Mon-Fri 5:00-8:00, Sat 12:00-6:00, Sun 10:00-4:00 o Babies seen by [facility] providers o Accepting Medicaid  [place] [facility] o [address], [place], [place]  [address] o [phone]   Fax - [phone]  [place] [phone])  [facility] [place], MD o [address] [place], [place], [place] [address] o [phone] o Mon-Fri 8:00-5:00 o Babies seen by [facility] providers o Does NOT accept Medicaid  [place] [facility] [facility] [place], MD o [address], [place], [place] [address] o [phone] o Mon-Thur 8:00-7:00, Fri 8:00-5:00, Sat 8:00-12:00 o Babies seen by providers at [facility] o Accepting Medicaid - but does not have vaccinations in office (must be received elsewhere) o Limited availability, please call early in hospitalization  [place] [phone])  [place], MD o [address] [place] [address] o [phone]  Fax [phone]   Contraception Choices Contraception, also called birth control, means things to use or ways to try not to get pregnant. Hormonal birth control This kind of birth control uses hormones. Here are some types of hormonal birth control:  A tube that is put  under skin of the arm (implant). The tube can stay in for as long as 3 years.  Shots to get every 3 months (injections).  Pills to take every day (birth control pills).  A patch to change 1 time each week for 3 weeks (birth control patch). After that, the patch is taken off for 1 week.  A ring to put in the vagina. The ring is left in for 3 weeks. Then it is taken out of the vagina for 1 week. Then a new ring is put in.  Pills to take after unprotected sex (emergency birth control pills). Barrier birth control Here are some types of barrier birth control:  A thin covering that is put on the penis before sex (female condom). The covering is thrown away after sex.  A soft, loose covering that is put in the vagina before sex (female condom). The covering is thrown away after sex.  A rubber bowl that sits over the cervix (diaphragm). The bowl must be made for you. The bowl  is put into the vagina before sex. The bowl is left in for 6-8 hours after sex. It is taken out within 24 hours.  A small, soft cup that fits over the cervix (cervical cap). The cup must be made for you. The cup can be left in for 6-8 hours after sex. It is taken out within 48 hours.  A sponge that is put into the vagina before sex. It must be left in for at least 6 hours after sex. It must be taken out within 30 hours. Then it is thrown away.  A chemical that kills or stops sperm from getting into the uterus (spermicide). It may be a pill, cream, jelly, or foam to put in the vagina. The chemical should be used at least 10-15 minutes before sex. IUD (intrauterine) birth control An IUD is a small, T-shaped piece of plastic. It is put inside the uterus. There are two kinds:  Hormone IUD. This kind can stay in for 3-5 years.  Copper IUD. This kind can stay in for 10 years. Permanent birth control Here are some types of permanent birth control:  Surgery to block the fallopian tubes.  Having an insert put into each  fallopian tube.  Surgery to tie off the tubes that carry sperm (vasectomy). Natural planning birth control Here are some types of natural planning birth control:  Not having sex on the days the woman could get pregnant.  Using a calendar: ? To keep track of the length of each period. ? To find out what days pregnancy can happen. ? To plan to not have sex on days when pregnancy can happen.  Watching for symptoms of ovulation and not having sex during ovulation. One way the woman can check for ovulation is to check her temperature.  Waiting to have sex until after ovulation. Summary  Contraception, also called birth control, means things to use or ways to try not to get pregnant.  Hormonal methods of birth control include implants, injections, pills, patches, vaginal rings, and emergency birth control pills.  Barrier methods of birth control can include female condoms, female condoms, diaphragms, cervical caps, sponges, and spermicides.  There are two types of IUD (intrauterine device) birth control. An IUD can be put in a woman's uterus to prevent pregnancy for 3-5 years.  Permanent sterilization can be done through a procedure for males, females, or both.  Natural planning methods involve not having sex on the days when the woman could get pregnant. This information is not intended to replace advice given to you by your health care provider. Make sure you discuss any questions you have with your health care provider. Document Released: 06/02/2009 Document Revised: 11/25/2018 Document Reviewed: 08/15/2016 Elsevier Patient Education  2020 [employer]

## 2019-05-05 NOTE — Progress Notes (Signed)
Pt states is not at home to take BP, will dos o when she gets home & record into [facility].

## 2019-05-11 ENCOUNTER — Telehealth: Payer: Medicaid Other | Admitting: Student

## 2019-05-27 ENCOUNTER — Telehealth: Payer: Self-pay

## 2019-05-27 NOTE — Telephone Encounter (Signed)
Received call from patient who reports having some tighten in her abdominal area. Patient reports pain comes and goes. I have advised her to try taking tyenol to see if she gets any relief.She can try and get some rest and keep hydrated. If the pain become frequent or severe she should report to the ER. Patient verbalizes understanding.

## 2019-06-02 ENCOUNTER — Encounter: Payer: Self-pay | Admitting: Medical

## 2019-06-02 ENCOUNTER — Other Ambulatory Visit (HOSPITAL_COMMUNITY)
Admission: RE | Admit: 2019-06-02 | Discharge: 2019-06-02 | Disposition: A | Discharge status: Home / Self Care | Payer: Medicaid Other | Source: Ambulatory Visit | Attending: Medical | Admitting: Medical

## 2019-06-02 ENCOUNTER — Ambulatory Visit (INDEPENDENT_AMBULATORY_CARE_PROVIDER_SITE_OTHER): Payer: Medicaid Other | Admitting: Medical

## 2019-06-02 ENCOUNTER — Other Ambulatory Visit: Payer: Self-pay

## 2019-06-02 VITALS — BP 119/76 | HR 95 | Temp 98.6°F | Wt 139.7 lb

## 2019-06-02 DIAGNOSIS — Z3493 Encounter for supervision of normal pregnancy, unspecified, third trimester: Secondary | ICD-10-CM

## 2019-06-02 DIAGNOSIS — Z23 Encounter for immunization: Secondary | ICD-10-CM

## 2019-06-02 DIAGNOSIS — Z3A36 36 weeks gestation of pregnancy: Secondary | ICD-10-CM

## 2019-06-02 LAB — OB RESULTS CONSOLE GBS: GBS: NEGATIVE

## 2019-06-02 LAB — POCT URINALYSIS DIP (DEVICE)
Bilirubin Urine: NEGATIVE
Glucose, UA: NEGATIVE mg/dL
Ketones, ur: NEGATIVE mg/dL
Leukocytes,Ua: NEGATIVE
Nitrite: NEGATIVE
Protein, ur: NEGATIVE mg/dL
Specific Gravity, Urine: 1.02 (ref 1.005–1.030)
Urobilinogen, UA: 1 mg/dL (ref 0.0–1.0)
pH: 7 (ref 5.0–8.0)

## 2019-06-02 NOTE — Progress Notes (Signed)
   PRENATAL VISIT NOTE  Subjective:  Katrina May is a 23 y.o. G2P0010 at [redacted]w[redacted]d being seen today for ongoing prenatal care.  She is currently monitored for the following issues for this low-risk pregnancy and has Supervision of low-risk pregnancy on their problem list.  Patient reports no complaints.  Contractions: Irritability. Vag. Bleeding: None.  Movement: Present. Denies leaking of fluid.   The following portions of the patient's history were reviewed and updated as appropriate: allergies, current medications, past family history, past medical history, past social history, past surgical history and problem list.   Objective:   Vitals:   06/02/19 1542  BP: 119/76  Pulse: 95  Temp: 98.6 F (37 C)  Weight: 139 lb 11.2 oz (63.4 kg)    Fetal Status: Fetal Heart Rate (bpm): 153 Fundal Height: 35 cm Movement: Present  Presentation: Vertex  General:  Alert, oriented and cooperative. Patient is in no acute distress.  Skin: Skin is warm and dry. No rash noted.   Cardiovascular: Normal heart rate noted  Respiratory: Normal respiratory effort, no problems with respiration noted  Abdomen: Soft, gravid, appropriate for gestational age.  Pain/Pressure: Present     Pelvic: Cervical exam performed Dilation: 1 Effacement (%): 20 Station: -3  Extremities: Normal range of motion.  Edema: Trace  Mental Status: Normal mood and affect. Normal behavior. Normal judgment and thought content.   Assessment and Plan:  Pregnancy: G2P0010 at [redacted]w[redacted]d 1. Encounter for supervision of low-risk pregnancy in third trimester - GC/Chlamydia probe amp ([place])not at [facility] - Culture, beta strep (group b only) - Flu Vaccine QUAD 36+ mos IM  Preterm labor symptoms and general obstetric precautions including but not limited to vaginal bleeding, contractions, leaking of fluid and fetal movement were reviewed in detail with the patient. Please refer to After Visit Summary for other counseling recommendations.    Return in about 2 weeks (around 06/16/2019) for LOB, In-Person.  Future Appointments  Date Time Provider [place]  06/09/2019  2:15 PM Danielle Rankin [facility] [place]  06/16/2019  1:15 PM Nugent, Gerrie Nordmann, NP [facility]  06/23/2019  1:15 PM Darlina Rumpf, [place]    Kerry Hough, [place]

## 2019-06-02 NOTE — Progress Notes (Signed)
Pt has burning  Sensation when she urinates.

## 2019-06-02 NOTE — Patient Instructions (Addendum)
Fetal Movement Counts Patient Name: ________________________________________________ Patient Due Date: ____________________ What is a fetal movement count?  A fetal movement count is the number of times that you feel your baby move during a certain amount of time. This may also be called a fetal kick count. A fetal movement count is recommended for every pregnant woman. You may be asked to start counting fetal movements as early as week 28 of your pregnancy. Pay attention to when your baby is most active. You may notice your baby's sleep and wake cycles. You may also notice things that make your baby move more. You should do a fetal movement count:  When your baby is normally most active.  At the same time each day. A good time to count movements is while you are resting, after having something to eat and drink. How do I count fetal movements? 1. Find a quiet, comfortable area. Sit, or lie down on your side. 2. Write down the date, the start time and stop time, and the number of movements that you felt between those two times. Take this information with you to your health care visits. 3. For 2 hours, count kicks, flutters, swishes, rolls, and jabs. You should feel at least 10 movements during 2 hours. 4. You may stop counting after you have felt 10 movements. 5. If you do not feel 10 movements in 2 hours, have something to eat and drink. Then, keep resting and counting for 1 hour. If you feel at least 4 movements during that hour, you may stop counting. Contact a health care provider if:  You feel fewer than 4 movements in 2 hours.  Your baby is not moving like he or she usually does. Date: ____________ Start time: ____________ Stop time: ____________ Movements: ____________ Date: ____________ Start time: ____________ Stop time: ____________ Movements: ____________ Date: ____________ Start time: ____________ Stop time: ____________ Movements: ____________ Date: ____________ Start time:  ____________ Stop time: ____________ Movements: ____________ Date: ____________ Start time: ____________ Stop time: ____________ Movements: ____________ Date: ____________ Start time: ____________ Stop time: ____________ Movements: ____________ Date: ____________ Start time: ____________ Stop time: ____________ Movements: ____________ Date: ____________ Start time: ____________ Stop time: ____________ Movements: ____________ Date: ____________ Start time: ____________ Stop time: ____________ Movements: ____________ This information is not intended to replace advice given to you by your health care provider. Make sure you discuss any questions you have with your health care provider. Document Released: 09/04/2006 Document Revised: 08/25/2018 Document Reviewed: 09/14/2015 Elsevier Patient Education  2020 Elsevier Inc. Braxton Hicks Contractions Contractions of the uterus can occur throughout pregnancy, but they are not always a sign that you are in labor. You may have practice contractions called Braxton Hicks contractions. These false labor contractions are sometimes confused with true labor. What are Braxton Hicks contractions? Braxton Hicks contractions are tightening movements that occur in the muscles of the uterus before labor. Unlike true labor contractions, these contractions do not result in opening (dilation) and thinning of the cervix. Toward the end of pregnancy (32-34 weeks), Braxton Hicks contractions can happen more often and may become stronger. These contractions are sometimes difficult to tell apart from true labor because they can be very uncomfortable. You should not feel embarrassed if you go to the hospital with false labor. Sometimes, the only way to tell if you are in true labor is for your health care provider to look for changes in the cervix. The health care provider will do a physical exam and may monitor your contractions. If you   are not in true labor, the exam should show  that your cervix is not dilating and your water has not broken. If there are no other health problems associated with your pregnancy, it is completely safe for you to be sent home with false labor. You may continue to have Braxton Hicks contractions until you go into true labor. How to tell the difference between true labor and false labor True labor  Contractions last 30-70 seconds.  Contractions become very regular.  Discomfort is usually felt in the top of the uterus, and it spreads to the lower abdomen and low back.  Contractions do not go away with walking.  Contractions usually become more intense and increase in frequency.  The cervix dilates and gets thinner. False labor  Contractions are usually shorter and not as strong as true labor contractions.  Contractions are usually irregular.  Contractions are often felt in the front of the lower abdomen and in the groin.  Contractions may go away when you walk around or change positions while lying down.  Contractions get weaker and are shorter-lasting as time goes on.  The cervix usually does not dilate or become thin. Follow these instructions at home:   Take over-the-counter and prescription medicines only as told by your health care provider.  Keep up with your usual exercises and follow other instructions from your health care provider.  Eat and drink lightly if you think you are going into labor.  If Braxton Hicks contractions are making you uncomfortable: ? Change your position from lying down or resting to walking, or change from walking to resting. ? Sit and rest in a tub of warm water. ? Drink enough fluid to keep your urine pale yellow. Dehydration may cause these contractions. ? Do slow and deep breathing several times an hour.  Keep all follow-up prenatal visits as told by your health care provider. This is important. Contact a health care provider if:  You have a fever.  You have continuous pain in  your abdomen. Get help right away if:  Your contractions become stronger, more regular, and closer together.  You have fluid leaking or gushing from your vagina.  You pass blood-tinged mucus (bloody show).  You have bleeding from your vagina.  You have low back pain that you never had before.  You feel your baby's head pushing down and causing pelvic pressure.  Your baby is not moving inside you as much as it used to. Summary  Contractions that occur before labor are called Braxton Hicks contractions, false labor, or practice contractions.  Braxton Hicks contractions are usually shorter, weaker, farther apart, and less regular than true labor contractions. True labor contractions usually become progressively stronger and regular, and they become more frequent.  Manage discomfort from Braxton Hicks contractions by changing position, resting in a warm bath, drinking plenty of water, or practicing deep breathing. This information is not intended to replace advice given to you by your health care provider. Make sure you discuss any questions you have with your health care provider. Document Released: 12/19/2016 Document Revised: 07/18/2017 Document Reviewed: 12/19/2016 Elsevier Patient Education  2020 Elsevier Inc.  Contraception Choices Contraception, also called birth control, means things to use or ways to try not to get pregnant. Hormonal birth control This kind of birth control uses hormones. Here are some types of hormonal birth control:  A tube that is put under skin of the arm (implant). The tube can stay in for as long as 3 years.    Shots to get every 3 months (injections).  Pills to take every day (birth control pills).  A patch to change 1 time each week for 3 weeks (birth control patch). After that, the patch is taken off for 1 week.  A ring to put in the vagina. The ring is left in for 3 weeks. Then it is taken out of the vagina for 1 week. Then a new ring is put in.   Pills to take after unprotected sex (emergency birth control pills). Barrier birth control Here are some types of barrier birth control:  A thin covering that is put on the penis before sex (female condom). The covering is thrown away after sex.  A soft, loose covering that is put in the vagina before sex (female condom). The covering is thrown away after sex.  A rubber bowl that sits over the cervix (diaphragm). The bowl must be made for you. The bowl is put into the vagina before sex. The bowl is left in for 6-8 hours after sex. It is taken out within 24 hours.  A small, soft cup that fits over the cervix (cervical cap). The cup must be made for you. The cup can be left in for 6-8 hours after sex. It is taken out within 48 hours.  A sponge that is put into the vagina before sex. It must be left in for at least 6 hours after sex. It must be taken out within 30 hours. Then it is thrown away.  A chemical that kills or stops sperm from getting into the uterus (spermicide). It may be a pill, cream, jelly, or foam to put in the vagina. The chemical should be used at least 10-15 minutes before sex. IUD (intrauterine) birth control An IUD is a small, T-shaped piece of plastic. It is put inside the uterus. There are two kinds:  Hormone IUD. This kind can stay in for 3-5 years.  Copper IUD. This kind can stay in for 10 years. Permanent birth control Here are some types of permanent birth control:  Surgery to block the fallopian tubes.  Having an insert put into each fallopian tube.  Surgery to tie off the tubes that carry sperm (vasectomy). Natural planning birth control Here are some types of natural planning birth control:  Not having sex on the days the woman could get pregnant.  Using a calendar: ? To keep track of the length of each period. ? To find out what days pregnancy can happen. ? To plan to not have sex on days when pregnancy can happen.  Watching for symptoms of  ovulation and not having sex during ovulation. One way the woman can check for ovulation is to check her temperature.  Waiting to have sex until after ovulation. Summary  Contraception, also called birth control, means things to use or ways to try not to get pregnant.  Hormonal methods of birth control include implants, injections, pills, patches, vaginal rings, and emergency birth control pills.  Barrier methods of birth control can include female condoms, female condoms, diaphragms, cervical caps, sponges, and spermicides.  There are two types of IUD (intrauterine device) birth control. An IUD can be put in a woman's uterus to prevent pregnancy for 3-5 years.  Permanent sterilization can be done through a procedure for males, females, or both.  Natural planning methods involve not having sex on the days when the woman could get pregnant. This information is not intended to replace advice given to you by your health   care provider. Make sure you discuss any questions you have with your health care provider. Document Released: 06/02/2009 Document Revised: 11/25/2018 Document Reviewed: 08/15/2016 Elsevier Patient Education  2020 Elsevier Inc.  

## 2019-06-06 LAB — CULTURE, BETA STREP (GROUP B ONLY): Strep Gp B Culture: NEGATIVE

## 2019-06-08 LAB — GC/CHLAMYDIA PROBE AMP (~~LOC~~) NOT AT ARMC
Chlamydia: NEGATIVE
Comment: NEGATIVE
Comment: NORMAL
Neisseria Gonorrhea: NEGATIVE

## 2019-06-09 ENCOUNTER — Telehealth: Payer: Medicaid Other | Admitting: Medical

## 2019-06-15 NOTE — Progress Notes (Signed)
Pt called by CMA and patient stated that she already has an appointment scheduled for tomorrow, in-person, which she prefers. Appt cancelled for today, pt to be seen tomorrow.  Nugent, Gerrie Nordmann, NP  1:16 PM 06/16/2019

## 2019-06-16 ENCOUNTER — Telehealth: Payer: Medicaid Other | Admitting: Women's Health

## 2019-06-16 ENCOUNTER — Other Ambulatory Visit: Payer: Self-pay

## 2019-06-17 ENCOUNTER — Ambulatory Visit (INDEPENDENT_AMBULATORY_CARE_PROVIDER_SITE_OTHER): Payer: Medicaid Other | Admitting: Obstetrics and Gynecology

## 2019-06-17 DIAGNOSIS — Z3A38 38 weeks gestation of pregnancy: Secondary | ICD-10-CM

## 2019-06-17 DIAGNOSIS — Z3493 Encounter for supervision of normal pregnancy, unspecified, third trimester: Secondary | ICD-10-CM

## 2019-06-17 NOTE — Progress Notes (Signed)
   PRENATAL VISIT NOTE  Subjective:  Katrina May is a 23 y.o. G2P0010 at [redacted]w[redacted]d being seen today for ongoing prenatal care.  She is currently monitored for the following issues for this low-risk pregnancy and has Supervision of low-risk pregnancy on their problem list.  Patient reports no complaints.  Contractions: Irritability. Vag. Bleeding: None.  Movement: Present. Denies leaking of fluid.   The following portions of the patient's history were reviewed and updated as appropriate: allergies, current medications, past family history, past medical history, past social history, past surgical history and problem list.   Objective:   Vitals:   06/17/19 1433  BP: 130/78  Pulse: 85  Weight: 139 lb (63 kg)    Fetal Status: Fetal Heart Rate (bpm): 138 Fundal Height: 37 cm Movement: Present  Presentation: Vertex  General:  Alert, oriented and cooperative. Patient is in no acute distress.  Skin: Skin is warm and dry. No rash noted.   Cardiovascular: Normal heart rate noted  Respiratory: Normal respiratory effort, no problems with respiration noted  Abdomen: Soft, gravid, appropriate for gestational age.  Pain/Pressure: Present     Pelvic: Cervical exam performed Dilation: 1.5 Effacement (%): 60 Station: -3  Extremities: Normal range of motion.  Edema: Trace  Mental Status: Normal mood and affect. Normal behavior. Normal judgment and thought content.   Assessment and Plan:  Pregnancy: G2P0010 at [redacted]w[redacted]d  1. Encounter for supervision of low-risk pregnancy in third trimester  Doing well today Discussed [place] at home [place] leaf tea and have intercourse.   Term labor symptoms and general obstetric precautions including but not limited to vaginal bleeding, contractions, leaking of fluid and fetal movement were reviewed in detail with the patient. Please refer to After Visit Summary for other counseling recommendations.   Return in about 1 week (around 06/24/2019),  or In person visit please..  Future Appointments  Date Time Provider [place]  07/01/2019  1:35 PM Rasch, Artist Pais, NP [facility]    Noni Saupe, NP

## 2019-06-23 ENCOUNTER — Encounter (HOSPITAL_COMMUNITY): Payer: Self-pay | Admitting: *Deleted

## 2019-06-23 ENCOUNTER — Telehealth (INDEPENDENT_AMBULATORY_CARE_PROVIDER_SITE_OTHER): Payer: Medicaid Other | Admitting: Lactation Services

## 2019-06-23 ENCOUNTER — Other Ambulatory Visit: Payer: Self-pay

## 2019-06-23 ENCOUNTER — Inpatient Hospital Stay (HOSPITAL_COMMUNITY)
Admission: AD | Admit: 2019-06-23 | Discharge: 2019-06-24 | Disposition: A | Discharge status: Home / Self Care | Payer: Medicaid Other | Attending: Obstetrics & Gynecology | Admitting: Obstetrics & Gynecology

## 2019-06-23 ENCOUNTER — Telehealth: Payer: Medicaid Other | Admitting: Advanced Practice Midwife

## 2019-06-23 DIAGNOSIS — N760 Acute vaginitis: Secondary | ICD-10-CM

## 2019-06-23 DIAGNOSIS — B9689 Other specified bacterial agents as the cause of diseases classified elsewhere: Secondary | ICD-10-CM

## 2019-06-23 DIAGNOSIS — Z0371 Encounter for suspected problem with amniotic cavity and membrane ruled out: Secondary | ICD-10-CM | POA: Insufficient documentation

## 2019-06-23 DIAGNOSIS — Z3493 Encounter for supervision of normal pregnancy, unspecified, third trimester: Secondary | ICD-10-CM

## 2019-06-23 DIAGNOSIS — Z3A39 39 weeks gestation of pregnancy: Secondary | ICD-10-CM | POA: Insufficient documentation

## 2019-06-23 NOTE — MAU Note (Signed)
Pt reports to MAU c/o possible SROM @ 2230. Pt reports a DFM today. Pt reports +FM on the car ride here. Pt reports some braxton hicks earlier today none currently.

## 2019-06-23 NOTE — Telephone Encounter (Signed)
Called and spoke with pt. She reports she was concerned infant was not moving a lot yesterday. She reports he is moving more today.   She denies bleeding or contractions. She reports some leaking that feels like she is voiding on herself. Discussed if she is leaking a clear fluid from her Vagina or feels like infant is not moving well, she needs to go to the Maternity Assessment Unit at the [facility] [place] for evaluation and not wait for her appt tomorrow.   Pt voiced understanding. Pt has appointment in the office tomorrow. Informed her not to wait if she is leaking fluids or baby not moving and to go to MAU.

## 2019-06-24 ENCOUNTER — Ambulatory Visit (INDEPENDENT_AMBULATORY_CARE_PROVIDER_SITE_OTHER): Payer: Medicaid Other | Admitting: Student

## 2019-06-24 VITALS — BP 124/86 | HR 90 | Wt 144.0 lb

## 2019-06-24 DIAGNOSIS — B9689 Other specified bacterial agents as the cause of diseases classified elsewhere: Secondary | ICD-10-CM | POA: Diagnosis not present

## 2019-06-24 DIAGNOSIS — Z3493 Encounter for supervision of normal pregnancy, unspecified, third trimester: Secondary | ICD-10-CM

## 2019-06-24 DIAGNOSIS — O368130 Decreased fetal movements, third trimester, not applicable or unspecified: Secondary | ICD-10-CM | POA: Diagnosis not present

## 2019-06-24 DIAGNOSIS — Z3A36 36 weeks gestation of pregnancy: Secondary | ICD-10-CM | POA: Diagnosis not present

## 2019-06-24 DIAGNOSIS — O23593 Infection of other part of genital tract in pregnancy, third trimester: Secondary | ICD-10-CM

## 2019-06-24 DIAGNOSIS — N760 Acute vaginitis: Secondary | ICD-10-CM

## 2019-06-24 DIAGNOSIS — Z0371 Encounter for suspected problem with amniotic cavity and membrane ruled out: Secondary | ICD-10-CM | POA: Diagnosis not present

## 2019-06-24 DIAGNOSIS — Z3A39 39 weeks gestation of pregnancy: Secondary | ICD-10-CM | POA: Diagnosis not present

## 2019-06-24 LAB — WET PREP, GENITAL
Sperm: NONE SEEN
Trich, Wet Prep: NONE SEEN
Yeast Wet Prep HPF POC: NONE SEEN

## 2019-06-24 LAB — POCT FERN TEST: POCT Fern Test: NEGATIVE

## 2019-06-24 MED ORDER — METRONIDAZOLE 500 MG PO TABS: 500.0000 mg | ORAL_TABLET | Freq: Two times a day (BID) | ORAL | 0 refills | Status: DC

## 2019-06-24 NOTE — MAU Provider Note (Signed)
History     [id number]  Arrival date and time: 06/23/19 2339    Chief Complaint  Patient presents with  . Rupture of Membranes     HPI Katrina May is a 23 y.o. at [redacted]w[redacted]d by 5wk [place], who presents for possible LOF and reduced FM.   Has noticed small trickles of fluid, thought it was likely urine Clear in color Slightly larger amount earlier today than in the past  In general less FM over past two days Very active since arriving to MAU Has noticed at least 10 movements since arrival  No contractions Denies VB   O/Positive/-- (05/07 1547)  OB History    Gravida  2   Para      Term      Preterm      AB  1   Living  0     SAB  1   TAB      Ectopic      Multiple      Live Births  0           Past Medical History:  Diagnosis Date  . Medical history non-contributory     Past Surgical History:  Procedure Laterality Date  . NO PAST SURGERIES      Family History  Problem Relation Age of Onset  . Diabetes Paternal Grandfather   . Healthy Mother   . Healthy Father     Social History   Socioeconomic History  . Marital status: Single    Spouse name: Not on file  . Number of children: Not on file  . Years of education: Not on file  . Highest education level: Not on file  Occupational History    Comment: unemployed  Social Needs  . Financial resource strain: Not on file  . Food insecurity    Worry: Never true    Inability: Never true  . Transportation needs    Medical: No    Non-medical: No  Tobacco Use  . Smoking status: Never Smoker  . Smokeless tobacco: Never Used  Substance and Sexual Activity  . Alcohol use: Never    Frequency: Never  . Drug use: Never  . Sexual activity: Yes  Lifestyle  . Physical activity    Days per week: Not on file    Minutes per session: Not on file  . Stress: Not on file  Relationships  . Social Herbalist on phone: Not on file    Gets together: Not on file    Attends religious service:  Not on file    Active member of club or organization: Not on file    Attends meetings of clubs or organizations: Not on file    Relationship status: Not on file  . Intimate partner violence    Fear of current or ex partner: No    Emotionally abused: No    Physically abused: No    Forced sexual activity: No  Other Topics Concern  . Not on file  Social History Narrative  . Not on file    No Known Allergies  No current facility-administered medications on file prior to encounter.    Current Outpatient Medications on File Prior to Encounter  Medication Sig Dispense Refill  . Elastic Bandages & Supports (COMFORT FIT MATERNITY SUPP SM) MISC 1 Units by Does not apply route daily as needed. 1 each 0  . Prenatal Vit-Fe Fumarate-FA (PRENATAL MULTIVITAMIN) TABS tablet Take 1 tablet by mouth daily at 12 noon.    Marland Kitchen  promethazine (PHENERGAN) 25 MG tablet Take 1 tablet (25 mg total) by mouth every 6 (six) hours as needed for nausea or vomiting. 30 tablet 1     ROS Complete ROS completed and otherwise negative except as noted in HPI  Physical Exam   LMP 09/19/2018 (Exact Date)   Physical Exam  Vitals reviewed. Constitutional: She appears well-developed and well-nourished. No distress.  Cardiovascular: Normal rate, regular rhythm and normal heart sounds.  No murmur heard. Respiratory: Effort normal and breath sounds normal. No respiratory distress. She has no wheezes. She has no rales.  GI: Soft. Bowel sounds are normal. She exhibits no distension. There is no abdominal tenderness. There is no rebound and no guarding.  Genitourinary:    Genitourinary Comments: SSE: neg pool, no LOF w valsalva SVE: 1/80/0   Musculoskeletal:        General: No edema.  Neurological: She is alert. Coordination normal.  Skin: Skin is warm and dry. She is not diaphoretic.  Psychiatric: She has a normal mood and affect.    Bedside Ultrasound Not performed  FHT Baseline 130, moderate variability,  +accels, no decels, poor toco baseline Cat I Reactive NST  Labs Results for orders placed or performed during the hospital encounter of 06/23/19 (from the past 24 hour(s))  Wet prep, genital     Status: Abnormal   Collection Time: 06/24/19 12:26 AM  Result Value Ref Range   Yeast Wet Prep HPF POC NONE SEEN NONE SEEN   Trich, Wet Prep NONE SEEN NONE SEEN   Clue Cells Wet Prep HPF POC PRESENT (A) NONE SEEN   WBC, Wet Prep HPF POC MODERATE (A) NONE SEEN   Sperm NONE SEEN   Fern Test     Status: Normal   Collection Time: 06/24/19 12:29 AM  Result Value Ref Range   POCT Fern Test Negative = intact amniotic membranes      MAU Course  Procedures Wet prep Fern NST  MDM Moderate  Assessment and Plan  #?LOF Neg pool, neg fern, ruled out for rupture Wet Prep +BV, rx sent for flagyl  #Decreased FM #FWB Cat I Reactive NST Very active since arrival to MAU Labor precautions reviewed, has appt tomorrow Membranes stripped per patient request   Venora Maples

## 2019-06-24 NOTE — Patient Instructions (Signed)
Fetal Movement Counts Patient Name: ________________________________________________ Patient Due Date: ____________________ What is a fetal movement count?  A fetal movement count is the number of times that you feel your baby move during a certain amount of time. This may also be called a fetal kick count. A fetal movement count is recommended for every pregnant woman. You may be asked to start counting fetal movements as early as week 28 of your pregnancy. Pay attention to when your baby is most active. You may notice your baby's sleep and wake cycles. You may also notice things that make your baby move more. You should do a fetal movement count:  When your baby is normally most active.  At the same time each day. A good time to count movements is while you are resting, after having something to eat and drink. How do I count fetal movements? 1. Find a quiet, comfortable area. Sit, or lie down on your side. 2. Write down the date, the start time and stop time, and the number of movements that you felt between those two times. Take this information with you to your health care visits. 3. For 2 hours, count kicks, flutters, swishes, rolls, and jabs. You should feel at least 10 movements during 2 hours. 4. You may stop counting after you have felt 10 movements. 5. If you do not feel 10 movements in 2 hours, have something to eat and drink. Then, keep resting and counting for 1 hour. If you feel at least 4 movements during that hour, you may stop counting. Contact a health care provider if:  You feel fewer than 4 movements in 2 hours.  Your baby is not moving like he or she usually does. Date: ____________ Start time: ____________ Stop time: ____________ Movements: ____________ Date: ____________ Start time: ____________ Stop time: ____________ Movements: ____________ Date: ____________ Start time: ____________ Stop time: ____________ Movements: ____________ Date: ____________ Start time:  ____________ Stop time: ____________ Movements: ____________ Date: ____________ Start time: ____________ Stop time: ____________ Movements: ____________ Date: ____________ Start time: ____________ Stop time: ____________ Movements: ____________ Date: ____________ Start time: ____________ Stop time: ____________ Movements: ____________ Date: ____________ Start time: ____________ Stop time: ____________ Movements: ____________ Date: ____________ Start time: ____________ Stop time: ____________ Movements: ____________ This information is not intended to replace advice given to you by your health care provider. Make sure you discuss any questions you have with your health care provider. Document Released: 09/04/2006 Document Revised: 08/25/2018 Document Reviewed: 09/14/2015 Elsevier Patient Education  2020 [employer] Signs and Symptoms of Labor Labor is your body's natural process of moving your baby, placenta, and umbilical cord out of your uterus. The process of labor usually starts when your baby is full-term, between 37 and 40 weeks of pregnancy. How will I know when I am close to going into labor? As your body prepares for labor and the birth of your baby, you may notice the following symptoms in the weeks and days before true labor starts:  Having a strong desire to get your home ready to receive your new baby. This is called nesting. Nesting may be a sign that labor is approaching, and it may occur several weeks before birth. Nesting may involve cleaning and organizing your home.  Passing a small amount of thick, bloody mucus out of your vagina (normal bloody show or losing your mucus plug). This may happen more than a week before labor begins, or it might occur right before labor begins as the opening of the cervix starts   to widen (dilate). For some women, the entire mucus plug passes at once. For others, smaller portions of the mucus plug may gradually pass over several days.  Your baby  moving (dropping) lower in your pelvis to get into position for birth (lightening). When this happens, you may feel more pressure on your bladder and pelvic bone and less pressure on your ribs. This may make it easier to breathe. It may also cause you to need to urinate more often and have problems with bowel movements.  Having "practice contractions" (Braxton Hicks contractions) that occur at irregular (unevenly spaced) intervals that are more than 10 minutes apart. This is also called false labor. False labor contractions are common after exercise or sexual activity, and they will stop if you change position, rest, or drink fluids. These contractions are usually mild and do not get stronger over time. They may feel like: ? A backache or back pain. ? Mild cramps, similar to menstrual cramps. ? Tightening or pressure in your abdomen. Other early symptoms that labor may be starting soon include:  Nausea or loss of appetite.  Diarrhea.  Having a sudden burst of energy, or feeling very tired.  Mood changes.  Having trouble sleeping. How will I know when labor has begun? Signs that true labor has begun may include:  Having contractions that come at regular (evenly spaced) intervals and increase in intensity. This may feel like more intense tightening or pressure in your abdomen that moves to your back. ? Contractions may also feel like rhythmic pain in your upper thighs or back that comes and goes at regular intervals. ? For first-time mothers, this change in intensity of contractions often occurs at a more gradual pace. ? Women who have given birth before may notice a more rapid progression of contraction changes.  Having a feeling of pressure in the vaginal area.  Your water breaking (rupture of membranes). This is when the sac of fluid that surrounds your baby breaks. When this happens, you will notice fluid leaking from your vagina. This may be clear or blood-tinged. Labor usually starts  within 24 hours of your water breaking, but it may take longer to begin. ? Some women notice this as a gush of fluid. ? Others notice that their underwear repeatedly becomes damp. Follow these instructions at home:   When labor starts, or if your water breaks, call your health care provider or nurse care line. Based on your situation, they will determine when you should go in for an exam.  When you are in early labor, you may be able to rest and manage symptoms at home. Some strategies to try at home include: ? Breathing and relaxation techniques. ? Taking a warm bath or shower. ? Listening to music. ? Using a heating pad on the lower back for pain. If you are directed to use heat:  Place a towel between your skin and the heat source.  Leave the heat on for 20-30 minutes.  Remove the heat if your skin turns bright red. This is especially important if you are unable to feel pain, heat, or cold. You may have a greater risk of getting burned. Get help right away if:  You have painful, regular contractions that are 5 minutes apart or less.  Labor starts before you are [redacted] weeks along in your pregnancy.  You have a fever.  You have a headache that does not go away.  You have bright red blood coming from your vagina.  You   do not feel your baby moving.  You have a sudden onset of: ? Severe headache with vision problems. ? Nausea, vomiting, or diarrhea. ? Chest pain or shortness of breath. These symptoms may be an emergency. If your health care provider recommends that you go to the hospital or birth center where you plan to deliver, do not drive yourself. Have someone else drive you, or call emergency services (911 in the [place] Summary  Labor is your body's natural process of moving your baby, placenta, and umbilical cord out of your uterus.  The process of labor usually starts when your baby is full-term, between 37 and 40 weeks of pregnancy.  When labor starts, or if your water  breaks, call your health care provider or nurse care line. Based on your situation, they will determine when you should go in for an exam. This information is not intended to replace advice given to you by your health care provider. Make sure you discuss any questions you have with your health care provider. Document Released: 01/10/2017 Document Revised: 05/05/2017 Document Reviewed: 01/10/2017 Elsevier Patient Education  2020 [employer] Inc[employer]  

## 2019-06-24 NOTE — Discharge Instructions (Signed)
Signs and Symptoms of Labor Labor is your body's natural process of moving your baby, placenta, and umbilical cord out of your uterus. The process of labor usually starts when your baby is full-term, between 37 and 40 weeks of pregnancy. How will I know when I am close to going into labor? As your body prepares for labor and the birth of your baby, you may notice the following symptoms in the weeks and days before true labor starts:  Having a strong desire to get your home ready to receive your new baby. This is called nesting. Nesting may be a sign that labor is approaching, and it may occur several weeks before birth. Nesting may involve cleaning and organizing your home.  Passing a small amount of thick, bloody mucus out of your vagina (normal bloody show or losing your mucus plug). This may happen more than a week before labor begins, or it might occur right before labor begins as the opening of the cervix starts to widen (dilate). For some women, the entire mucus plug passes at once. For others, smaller portions of the mucus plug may gradually pass over several days.  Your baby moving (dropping) lower in your pelvis to get into position for birth (lightening). When this happens, you may feel more pressure on your bladder and pelvic bone and less pressure on your ribs. This may make it easier to breathe. It may also cause you to need to urinate more often and have problems with bowel movements.  Having "practice contractions" (Braxton Hicks contractions) that occur at irregular (unevenly spaced) intervals that are more than 10 minutes apart. This is also called false labor. False labor contractions are common after exercise or sexual activity, and they will stop if you change position, rest, or drink fluids. These contractions are usually mild and do not get stronger over time. They may feel like: ? A backache or back pain. ? Mild cramps, similar to menstrual cramps. ? Tightening or pressure in  your abdomen. Other early symptoms that labor may be starting soon include:  Nausea or loss of appetite.  Diarrhea.  Having a sudden burst of energy, or feeling very tired.  Mood changes.  Having trouble sleeping. How will I know when labor has begun? Signs that true labor has begun may include:  Having contractions that come at regular (evenly spaced) intervals and increase in intensity. This may feel like more intense tightening or pressure in your abdomen that moves to your back. ? Contractions may also feel like rhythmic pain in your upper thighs or back that comes and goes at regular intervals. ? For first-time mothers, this change in intensity of contractions often occurs at a more gradual pace. ? Women who have given birth before may notice a more rapid progression of contraction changes.  Having a feeling of pressure in the vaginal area.  Your water breaking (rupture of membranes). This is when the sac of fluid that surrounds your baby breaks. When this happens, you will notice fluid leaking from your vagina. This may be clear or blood-tinged. Labor usually starts within 24 hours of your water breaking, but it may take longer to begin. ? Some women notice this as a gush of fluid. ? Others notice that their underwear repeatedly becomes damp. Follow these instructions at home:   When labor starts, or if your water breaks, call your health care provider or nurse care line. Based on your situation, they will determine when you should go in for an   exam.  When you are in early labor, you may be able to rest and manage symptoms at home. Some strategies to try at home include: ? Breathing and relaxation techniques. ? Taking a warm bath or shower. ? Listening to music. ? Using a heating pad on the lower back for pain. If you are directed to use heat:  Place a towel between your skin and the heat source.  Leave the heat on for 20-30 minutes.  Remove the heat if your skin turns  bright red. This is especially important if you are unable to feel pain, heat, or cold. You may have a greater risk of getting burned. Get help right away if:  You have painful, regular contractions that are 5 minutes apart or less.  Labor starts before you are [redacted] weeks along in your pregnancy.  You have a fever.  You have a headache that does not go away.  You have bright red blood coming from your vagina.  You do not feel your baby moving.  You have a sudden onset of: ? Severe headache with vision problems. ? Nausea, vomiting, or diarrhea. ? Chest pain or shortness of breath. These symptoms may be an emergency. If your health care provider recommends that you go to the hospital or birth center where you plan to deliver, do not drive yourself. Have someone else drive you, or call emergency services (911 in the [place] Summary  Labor is your body's natural process of moving your baby, placenta, and umbilical cord out of your uterus.  The process of labor usually starts when your baby is full-term, between 47 and 40 weeks of pregnancy.  When labor starts, or if your water breaks, call your health care provider or nurse care line. Based on your situation, they will determine when you should go in for an exam. This information is not intended to replace advice given to you by your health care provider. Make sure you discuss any questions you have with your health care provider. Document Released: 01/10/2017 Document Revised: 05/05/2017 Document Reviewed: 01/10/2017 Elsevier Patient Education  2020 [place].     Bacterial Vaginosis  Bacterial vaginosis is an infection of the vagina. It happens when too many normal germs (healthy bacteria) grow in the vagina. This infection puts you at risk for infections from sex (STIs). Treating this infection can lower your risk for some STIs. You should also treat this if you are pregnant. It can cause your baby to be born early. Follow these  instructions at home: Medicines  Take over-the-counter and prescription medicines only as told by your doctor.  Take or use your antibiotic medicine as told by your doctor. Do not stop taking or using it even if you start to feel better. General instructions  If you your sexual partner is a woman, tell her that you have this infection. She needs to get treatment if she has symptoms. If you have a female partner, he does not need to be treated.  During treatment: ? Avoid sex. ? Do not douche. ? Avoid alcohol as told. ? Avoid breastfeeding as told.  Drink enough fluid to keep your pee (urine) clear or pale yellow.  Keep your vagina and butt (rectum) clean. ? Wash the area with warm water every day. ? Wipe from front to back after you use the toilet.  Keep all follow-up visits as told by your doctor. This is important. Preventing this condition  Do not douche.  Use only warm water to  wash around your vagina.  Use protection when you have sex. This includes: ? Latex condoms. ? Dental dams.  Limit how many people you have sex with. It is best to only have sex with the same person (be monogamous).  Get tested for STIs. Have your partner get tested.  Wear underwear that is cotton or lined with cotton.  Avoid tight pants and pantyhose. This is most important in summer.  Do not use any products that have nicotine or tobacco in them. These include cigarettes and e-cigarettes. If you need help quitting, ask your doctor.  Do not use illegal drugs.  Limit how much alcohol you drink. Contact a doctor if:  Your symptoms do not get better, even after you are treated.  You have more discharge or pain when you pee (urinate).  You have a fever.  You have pain in your belly (abdomen).  You have pain with sex.  Your bleed from your vagina between periods. Summary  This infection happens when too many germs (bacteria) grow in the vagina.  Treating this condition can lower your  risk for some infections from sex (STIs).  You should also treat this if you are pregnant. It can cause early (premature) birth.  Do not stop taking or using your antibiotic medicine even if you start to feel better. This information is not intended to replace advice given to you by your health care provider. Make sure you discuss any questions you have with your health care provider. Document Released: 05/14/2008 Document Revised: 07/18/2017 Document Reviewed: 04/20/2016 Elsevier Patient Education  2020 [employer].

## 2019-06-24 NOTE — Progress Notes (Signed)
IOL scheduled for November 14th in morning. IOL faxed to L&D.

## 2019-06-24 NOTE — Progress Notes (Signed)
   PRENATAL VISIT NOTE  Subjective:  Katrina May is a 23 y.o. G2P0010 at [redacted]w[redacted]d being seen today for ongoing prenatal care.  She is currently monitored for the following issues for this low-risk pregnancy and has Supervision of low-risk pregnancy on their problem list.  Patient reports occasional contractions.  Contractions: Irritability. Vag. Bleeding: Scant.  Movement: Present. Denies leaking of fluid.   The following portions of the patient's history were reviewed and updated as appropriate: allergies, current medications, past family history, past medical history, past social history, past surgical history and problem list.   Objective:   Vitals:   06/24/19 1439  BP: 124/86  Pulse: 90  Weight: 144 lb (65.3 kg)    Fetal Status: Fetal Heart Rate (bpm): 142 Fundal Height: 38 cm Movement: Present     General:  Alert, oriented and cooperative. Patient is in no acute distress.  Skin: Skin is warm and dry. No rash noted.   Cardiovascular: Normal heart rate noted  Respiratory: Normal respiratory effort, no problems with respiration noted  Abdomen: Soft, gravid, appropriate for gestational age.  Pain/Pressure: Present     Pelvic: Cervical exam deferred        Extremities: Normal range of motion.  Edema: Trace  Mental Status: Normal mood and affect. Normal behavior. Normal judgment and thought content.   Assessment and Plan:  Pregnancy: G2P0010 at [redacted]w[redacted]d 1. Encounter for supervision of low-risk pregnancy in third trimester -doing well. Some cramping since membrane sweep in MAU last night -will schedule for 41 wk IOL. Has appt scheduled next week with BPP/NST  Term labor symptoms and general obstetric precautions including but not limited to vaginal bleeding, contractions, leaking of fluid and fetal movement were reviewed in detail with the patient. Please refer to After Visit Summary for other counseling recommendations.   No follow-ups on file.  Future Appointments  Date Time  Provider [place]  07/01/2019  1:35 PM Rasch, Artist Pais, NP [facility]  07/03/2019  7:15 AM [facility] SCHED ROOM [facility] None    Jorje Guild, NP

## 2019-06-27 ENCOUNTER — Other Ambulatory Visit: Payer: Self-pay

## 2019-06-27 ENCOUNTER — Inpatient Hospital Stay (HOSPITAL_COMMUNITY): Payer: Medicaid Other | Admitting: Anesthesiology

## 2019-06-27 ENCOUNTER — Encounter (HOSPITAL_COMMUNITY)
Admission: AD | Disposition: A | Discharge status: Home / Self Care | Payer: Self-pay | Source: Home / Self Care | Attending: Obstetrics & Gynecology

## 2019-06-27 ENCOUNTER — Encounter (HOSPITAL_COMMUNITY): Payer: Self-pay

## 2019-06-27 ENCOUNTER — Inpatient Hospital Stay (HOSPITAL_COMMUNITY)
Admission: AD | Admit: 2019-06-27 | Discharge: 2019-06-29 | DRG: 788 | Disposition: A | Discharge status: Home / Self Care | Payer: Medicaid Other | Attending: Obstetrics & Gynecology | Admitting: Obstetrics & Gynecology

## 2019-06-27 DIAGNOSIS — Z3493 Encounter for supervision of normal pregnancy, unspecified, third trimester: Secondary | ICD-10-CM

## 2019-06-27 DIAGNOSIS — O26893 Other specified pregnancy related conditions, third trimester: Secondary | ICD-10-CM | POA: Diagnosis present

## 2019-06-27 DIAGNOSIS — Z98891 History of uterine scar from previous surgery: Secondary | ICD-10-CM

## 2019-06-27 DIAGNOSIS — O1002 Pre-existing essential hypertension complicating childbirth: Secondary | ICD-10-CM | POA: Diagnosis present

## 2019-06-27 DIAGNOSIS — Z20828 Contact with and (suspected) exposure to other viral communicable diseases: Secondary | ICD-10-CM | POA: Diagnosis present

## 2019-06-27 DIAGNOSIS — Z3A40 40 weeks gestation of pregnancy: Secondary | ICD-10-CM

## 2019-06-27 DIAGNOSIS — O480 Post-term pregnancy: Secondary | ICD-10-CM | POA: Diagnosis not present

## 2019-06-27 DIAGNOSIS — O4292 Full-term premature rupture of membranes, unspecified as to length of time between rupture and onset of labor: Secondary | ICD-10-CM | POA: Diagnosis present

## 2019-06-27 HISTORY — DX: History of uterine scar from previous surgery: Z98.891

## 2019-06-27 LAB — COMPREHENSIVE METABOLIC PANEL
ALT: 12 U/L (ref 0–44)
AST: 18 U/L (ref 15–41)
Albumin: 2.7 g/dL — ABNORMAL LOW (ref 3.5–5.0)
Alkaline Phosphatase: 141 U/L — ABNORMAL HIGH (ref 38–126)
Anion gap: 10 (ref 5–15)
BUN: <5 mg/dL — ABNORMAL LOW (ref 6–20)
CO2: 20 mmol/L — ABNORMAL LOW (ref 22–32)
Calcium: 8.9 mg/dL (ref 8.9–10.3)
Chloride: 107 mmol/L (ref 98–111)
Creatinine, Ser: 0.53 mg/dL (ref 0.44–1.00)
GFR calc Af Amer: >60 mL/min (ref >60)
GFR calc non Af Amer: >60 mL/min (ref >60)
Glucose, Bld: 77 mg/dL (ref 70–99)
Potassium: 3.7 mmol/L (ref 3.5–5.1)
Sodium: 137 mmol/L (ref 135–145)
Total Bilirubin: 0.4 mg/dL (ref 0.3–1.2)
Total Protein: 6.4 g/dL — ABNORMAL LOW (ref 6.5–8.1)

## 2019-06-27 LAB — CBC
HCT: 34.5 % — ABNORMAL LOW (ref 36.0–46.0)
HCT: 35.1 % — ABNORMAL LOW (ref 36.0–46.0)
Hemoglobin: 11.2 g/dL — ABNORMAL LOW (ref 12.0–15.0)
Hemoglobin: 11.6 g/dL — ABNORMAL LOW (ref 12.0–15.0)
MCH: 29.2 pg (ref 26.0–34.0)
MCH: 28.9 pg (ref 26.0–34.0)
MCHC: 32.5 g/dL (ref 30.0–36.0)
MCHC: 33 g/dL (ref 30.0–36.0)
MCV: 89.8 fL (ref 80.0–100.0)
MCV: 87.5 fL (ref 80.0–100.0)
Platelets: 134 10*3/uL — ABNORMAL LOW (ref 150–400)
Platelets: 130 K/uL — ABNORMAL LOW (ref 150–400)
RBC: 3.84 MIL/uL — ABNORMAL LOW (ref 3.87–5.11)
RBC: 4.01 MIL/uL (ref 3.87–5.11)
RDW: 14 % (ref 11.5–15.5)
RDW: 13.8 % (ref 11.5–15.5)
WBC: 10.1 10*3/uL (ref 4.0–10.5)
WBC: 11.4 K/uL — ABNORMAL HIGH (ref 4.0–10.5)
nRBC: 0 % (ref 0.0–0.2)
nRBC: 0 % (ref 0.0–0.2)

## 2019-06-27 LAB — SARS CORONAVIRUS 2 (TAT 6-24 HRS): SARS Coronavirus 2: NEGATIVE

## 2019-06-27 LAB — RAPID URINE DRUG SCREEN, HOSP PERFORMED
Amphetamines: NOT DETECTED
Barbiturates: NOT DETECTED
Benzodiazepines: NOT DETECTED
Cocaine: NOT DETECTED
Opiates: NOT DETECTED
Tetrahydrocannabinol: NOT DETECTED

## 2019-06-27 LAB — PROTEIN / CREATININE RATIO, URINE
Creatinine, Urine: 28.82 mg/dL
Protein Creatinine Ratio: 0.21 mg/mg{Cre} — ABNORMAL HIGH (ref 0.00–0.15)
Total Protein, Urine: 6 mg/dL

## 2019-06-27 LAB — AMNISURE RUPTURE OF MEMBRANE (ROM) NOT AT ARMC: Amnisure ROM: NEGATIVE

## 2019-06-27 LAB — RPR
RPR Ser Ql: REACTIVE — AB
RPR Titer: 1:4 {titer}

## 2019-06-27 LAB — TYPE AND SCREEN
ABO/RH(D): O POS
Antibody Screen: NEGATIVE

## 2019-06-27 LAB — ABO/RH: ABO/RH(D): O POS

## 2019-06-27 LAB — POCT FERN TEST
POCT Fern Test: NEGATIVE
POCT Fern Test: POSITIVE

## 2019-06-27 SURGERY — Surgical Case
Anesthesia: Epidural | Site: Abdomen | Wound class: Clean Contaminated

## 2019-06-27 MED ORDER — SOD CITRATE-CITRIC ACID 500-334 MG/5ML PO SOLN
ORAL | Status: AC
  Filled 2019-06-27: qty 30

## 2019-06-27 MED ORDER — NALBUPHINE HCL 10 MG/ML IJ SOLN
5.0000 mg | INTRAMUSCULAR | Status: DC | PRN
  Filled 2019-06-27: qty 0.5

## 2019-06-27 MED ORDER — LACTATED RINGERS IV SOLN
500.0000 mL | Freq: Once | INTRAVENOUS | Status: AC
  Administered 2019-06-27: 500 mL via INTRAVENOUS

## 2019-06-27 MED ORDER — PHENYLEPHRINE 40 MCG/ML (10ML) SYRINGE FOR IV PUSH (FOR BLOOD PRESSURE SUPPORT)
PREFILLED_SYRINGE | INTRAVENOUS | Status: AC
  Filled 2019-06-27: qty 20

## 2019-06-27 MED ORDER — DEXAMETHASONE SODIUM PHOSPHATE 10 MG/ML IJ SOLN
INTRAMUSCULAR | Status: AC
  Filled 2019-06-27: qty 1

## 2019-06-27 MED ORDER — EPHEDRINE 5 MG/ML INJ: 10.0000 mg | INTRAVENOUS | Status: DC | PRN

## 2019-06-27 MED ORDER — SCOPOLAMINE 1 MG/3DAYS TD PT72: 1.0000 | MEDICATED_PATCH | Freq: Once | TRANSDERMAL | Status: DC

## 2019-06-27 MED ORDER — FENTANYL CITRATE (PF) 100 MCG/2ML IJ SOLN
INTRAMUSCULAR | Status: DC | PRN
  Administered 2019-06-27: 20 ug via INTRATHECAL

## 2019-06-27 MED ORDER — SODIUM CHLORIDE 0.9% FLUSH: 3.0000 mL | INTRAVENOUS | Status: DC | PRN

## 2019-06-27 MED ORDER — OXYTOCIN 40 UNITS IN NORMAL SALINE INFUSION - SIMPLE MED
INTRAVENOUS | Status: AC
  Filled 2019-06-27: qty 1000

## 2019-06-27 MED ORDER — ONDANSETRON HCL 4 MG/2ML IJ SOLN: 4.0000 mg | Freq: Once | INTRAMUSCULAR | Status: DC | PRN

## 2019-06-27 MED ORDER — NALOXONE HCL 0.4 MG/ML IJ SOLN: 0.4000 mg | INTRAMUSCULAR | Status: DC | PRN

## 2019-06-27 MED ORDER — OXYCODONE-ACETAMINOPHEN 5-325 MG PO TABS: 1.0000 | ORAL_TABLET | ORAL | Status: DC | PRN

## 2019-06-27 MED ORDER — KETOROLAC TROMETHAMINE 30 MG/ML IJ SOLN: 30.0000 mg | Freq: Once | INTRAMUSCULAR | Status: DC

## 2019-06-27 MED ORDER — PHENYLEPHRINE 40 MCG/ML (10ML) SYRINGE FOR IV PUSH (FOR BLOOD PRESSURE SUPPORT)
80.0000 ug | PREFILLED_SYRINGE | INTRAVENOUS | Status: DC | PRN
  Filled 2019-06-27: qty 10

## 2019-06-27 MED ORDER — HYDROMORPHONE HCL 1 MG/ML IJ SOLN: 1.0000 mg | INTRAMUSCULAR | Status: DC | PRN

## 2019-06-27 MED ORDER — KETOROLAC TROMETHAMINE 30 MG/ML IJ SOLN
INTRAMUSCULAR | Status: AC
  Filled 2019-06-27: qty 1

## 2019-06-27 MED ORDER — KETOROLAC TROMETHAMINE 30 MG/ML IJ SOLN
30.0000 mg | Freq: Four times a day (QID) | INTRAMUSCULAR | Status: AC
  Administered 2019-06-28 (×2): 30 mg via INTRAVENOUS
  Filled 2019-06-27 (×2): qty 1

## 2019-06-27 MED ORDER — CEFAZOLIN SODIUM-DEXTROSE 2-3 GM-%(50ML) IV SOLR
INTRAVENOUS | Status: DC | PRN
  Administered 2019-06-27: 2 g via INTRAVENOUS

## 2019-06-27 MED ORDER — GABAPENTIN 300 MG PO CAPS
300.0000 mg | ORAL_CAPSULE | Freq: Two times a day (BID) | ORAL | Status: DC
  Administered 2019-06-28 – 2019-06-29 (×4): 300 mg via ORAL
  Filled 2019-06-27 (×4): qty 1

## 2019-06-27 MED ORDER — MEPERIDINE HCL 25 MG/ML IJ SOLN: 6.2500 mg | INTRAMUSCULAR | Status: DC | PRN

## 2019-06-27 MED ORDER — FENTANYL-BUPIVACAINE-NACL 0.5-0.125-0.9 MG/250ML-% EP SOLN
12.0000 mL/h | EPIDURAL | Status: DC | PRN
  Filled 2019-06-27: qty 250

## 2019-06-27 MED ORDER — NALBUPHINE HCL 10 MG/ML IJ SOLN
5.0000 mg | Freq: Once | INTRAMUSCULAR | Status: DC | PRN
  Filled 2019-06-27: qty 0.5

## 2019-06-27 MED ORDER — SIMETHICONE 80 MG PO CHEW
80.0000 mg | CHEWABLE_TABLET | ORAL | Status: DC | PRN
  Administered 2019-06-28: 80 mg via ORAL
  Filled 2019-06-27: qty 1

## 2019-06-27 MED ORDER — OXYCODONE-ACETAMINOPHEN 5-325 MG PO TABS
2.0000 | ORAL_TABLET | ORAL | Status: DC | PRN
  Filled 2019-06-27: qty 2

## 2019-06-27 MED ORDER — DIPHENHYDRAMINE HCL 50 MG/ML IJ SOLN: 12.5000 mg | INTRAMUSCULAR | Status: DC | PRN

## 2019-06-27 MED ORDER — SIMETHICONE 80 MG PO CHEW
80.0000 mg | CHEWABLE_TABLET | ORAL | Status: DC
  Administered 2019-06-28 (×2): 80 mg via ORAL
  Filled 2019-06-27 (×2): qty 1

## 2019-06-27 MED ORDER — MORPHINE SULFATE (PF) 0.5 MG/ML IJ SOLN
INTRAMUSCULAR | Status: DC | PRN
  Administered 2019-06-27: 3 mg via EPIDURAL

## 2019-06-27 MED ORDER — OXYTOCIN 40 UNITS IN NORMAL SALINE INFUSION - SIMPLE MED
2.5000 [IU]/h | INTRAVENOUS | Status: DC
  Filled 2019-06-27: qty 1000

## 2019-06-27 MED ORDER — LIDOCAINE HCL (PF) 1 % IJ SOLN
INTRAMUSCULAR | Status: DC | PRN
  Administered 2019-06-27: 10 mL
  Administered 2019-06-27: 4 mL via EPIDURAL
  Administered 2019-06-27: 6 mL via EPIDURAL

## 2019-06-27 MED ORDER — LACTATED RINGERS IV SOLN
INTRAVENOUS | Status: DC
  Administered 2019-06-27 (×3): via INTRAVENOUS

## 2019-06-27 MED ORDER — MAGNESIUM HYDROXIDE 400 MG/5ML PO SUSP: 30.0000 mL | ORAL | Status: DC | PRN

## 2019-06-27 MED ORDER — DIPHENHYDRAMINE HCL 25 MG PO CAPS
25.0000 mg | ORAL_CAPSULE | Freq: Four times a day (QID) | ORAL | Status: DC | PRN
  Administered 2019-06-28: 25 mg via ORAL

## 2019-06-27 MED ORDER — SODIUM CHLORIDE (PF) 0.9 % IJ SOLN
INTRAMUSCULAR | Status: DC | PRN
  Administered 2019-06-27: 12 mL/h via EPIDURAL

## 2019-06-27 MED ORDER — ONDANSETRON HCL 4 MG/2ML IJ SOLN: 4.0000 mg | Freq: Four times a day (QID) | INTRAMUSCULAR | Status: DC | PRN

## 2019-06-27 MED ORDER — ENOXAPARIN SODIUM 40 MG/0.4ML ~~LOC~~ SOLN
40.0000 mg | SUBCUTANEOUS | Status: DC
  Administered 2019-06-28: 40 mg via SUBCUTANEOUS
  Filled 2019-06-27: qty 0.4

## 2019-06-27 MED ORDER — WITCH HAZEL-GLYCERIN EX PADS: 1.0000 "application " | MEDICATED_PAD | CUTANEOUS | Status: DC | PRN

## 2019-06-27 MED ORDER — ONDANSETRON HCL 4 MG/2ML IJ SOLN: 4.0000 mg | Freq: Three times a day (TID) | INTRAMUSCULAR | Status: DC | PRN

## 2019-06-27 MED ORDER — FENTANYL CITRATE (PF) 100 MCG/2ML IJ SOLN
50.0000 ug | INTRAMUSCULAR | Status: DC | PRN
  Administered 2019-06-27: 50 ug via INTRAVENOUS
  Filled 2019-06-27 (×2): qty 2

## 2019-06-27 MED ORDER — STERILE WATER FOR IRRIGATION IR SOLN
Status: DC | PRN
  Administered 2019-06-27: 1000 mL

## 2019-06-27 MED ORDER — LIDOCAINE HCL (PF) 1 % IJ SOLN: 30.0000 mL | INTRAMUSCULAR | Status: DC | PRN

## 2019-06-27 MED ORDER — IBUPROFEN 800 MG PO TABS
800.0000 mg | ORAL_TABLET | Freq: Four times a day (QID) | ORAL | Status: DC
  Administered 2019-06-28 – 2019-06-29 (×3): 800 mg via ORAL
  Filled 2019-06-27 (×4): qty 1

## 2019-06-27 MED ORDER — FERROUS SULFATE 325 (65 FE) MG PO TABS
325.0000 mg | ORAL_TABLET | Freq: Two times a day (BID) | ORAL | Status: DC
  Administered 2019-06-28: 325 mg via ORAL
  Filled 2019-06-27: qty 1

## 2019-06-27 MED ORDER — DEXAMETHASONE SODIUM PHOSPHATE 4 MG/ML IJ SOLN
INTRAMUSCULAR | Status: DC | PRN
  Administered 2019-06-27: 4 mg via INTRAVENOUS

## 2019-06-27 MED ORDER — PHENYLEPHRINE HCL (PRESSORS) 10 MG/ML IV SOLN
INTRAVENOUS | Status: DC | PRN
  Administered 2019-06-27 (×3): 100 ug via INTRAVENOUS
  Administered 2019-06-27: 80 ug via INTRAVENOUS
  Administered 2019-06-27: 100 ug via INTRAVENOUS

## 2019-06-27 MED ORDER — ZOLPIDEM TARTRATE 5 MG PO TABS: 5.0000 mg | ORAL_TABLET | Freq: Every evening | ORAL | Status: DC | PRN

## 2019-06-27 MED ORDER — PHENYLEPHRINE 40 MCG/ML (10ML) SYRINGE FOR IV PUSH (FOR BLOOD PRESSURE SUPPORT): 80.0000 ug | PREFILLED_SYRINGE | INTRAVENOUS | Status: DC | PRN

## 2019-06-27 MED ORDER — SODIUM CHLORIDE 0.9 % IV SOLN
INTRAVENOUS | Status: DC | PRN
  Administered 2019-06-27: 18:00:00 via INTRAVENOUS

## 2019-06-27 MED ORDER — ONDANSETRON HCL 4 MG/2ML IJ SOLN
INTRAMUSCULAR | Status: DC | PRN
  Administered 2019-06-27: 4 mg via INTRAVENOUS

## 2019-06-27 MED ORDER — OXYCODONE HCL 5 MG/5ML PO SOLN: 5.0000 mg | Freq: Once | ORAL | Status: DC | PRN

## 2019-06-27 MED ORDER — COCONUT OIL OIL: 1.0000 "application " | TOPICAL_OIL | Status: DC | PRN

## 2019-06-27 MED ORDER — SOD CITRATE-CITRIC ACID 500-334 MG/5ML PO SOLN
30.0000 mL | ORAL | Status: DC | PRN
  Administered 2019-06-27: 30 mL via ORAL

## 2019-06-27 MED ORDER — OXYCODONE HCL 5 MG PO TABS
5.0000 mg | ORAL_TABLET | ORAL | Status: DC | PRN
  Administered 2019-06-28 – 2019-06-29 (×2): 5 mg via ORAL
  Administered 2019-06-29: 10 mg via ORAL
  Filled 2019-06-27 (×2): qty 1
  Filled 2019-06-27: qty 2

## 2019-06-27 MED ORDER — SODIUM CHLORIDE 0.9 % IR SOLN
Status: DC | PRN
  Administered 2019-06-27: 1000 mL

## 2019-06-27 MED ORDER — LACTATED RINGERS IV SOLN
500.0000 mL | INTRAVENOUS | Status: DC | PRN
  Administered 2019-06-27: 500 mL via INTRAVENOUS

## 2019-06-27 MED ORDER — LACTATED RINGERS IV SOLN
INTRAVENOUS | Status: DC
  Administered 2019-06-27 – 2019-06-28 (×2): via INTRAVENOUS

## 2019-06-27 MED ORDER — LACTATED RINGERS IV SOLN
INTRAVENOUS | Status: DC | PRN
  Administered 2019-06-27 (×2): via INTRAVENOUS

## 2019-06-27 MED ORDER — NALOXONE HCL 4 MG/10ML IJ SOLN
1.0000 ug/kg/h | INTRAVENOUS | Status: DC | PRN
  Filled 2019-06-27: qty 5

## 2019-06-27 MED ORDER — PRENATAL MULTIVITAMIN CH
1.0000 | ORAL_TABLET | Freq: Every day | ORAL | Status: DC
  Administered 2019-06-28: 1 via ORAL
  Filled 2019-06-27: qty 1

## 2019-06-27 MED ORDER — DIPHENHYDRAMINE HCL 25 MG PO CAPS
25.0000 mg | ORAL_CAPSULE | ORAL | Status: DC | PRN
  Filled 2019-06-27: qty 1

## 2019-06-27 MED ORDER — TERBUTALINE SULFATE 1 MG/ML IJ SOLN
0.2500 mg | Freq: Once | INTRAMUSCULAR | Status: AC | PRN
  Administered 2019-06-27: 0.25 mg via SUBCUTANEOUS
  Filled 2019-06-27: qty 1

## 2019-06-27 MED ORDER — FENTANYL CITRATE (PF) 100 MCG/2ML IJ SOLN: 25.0000 ug | INTRAMUSCULAR | Status: DC | PRN

## 2019-06-27 MED ORDER — KETOROLAC TROMETHAMINE 30 MG/ML IJ SOLN
30.0000 mg | Freq: Once | INTRAMUSCULAR | Status: AC | PRN
  Administered 2019-06-27: 30 mg via INTRAVENOUS

## 2019-06-27 MED ORDER — MORPHINE SULFATE (PF) 0.5 MG/ML IJ SOLN
INTRAMUSCULAR | Status: AC
  Filled 2019-06-27: qty 10

## 2019-06-27 MED ORDER — ONDANSETRON HCL 4 MG/2ML IJ SOLN
INTRAMUSCULAR | Status: AC
  Filled 2019-06-27: qty 2

## 2019-06-27 MED ORDER — TETANUS-DIPHTH-ACELL PERTUSSIS 5-2.5-18.5 LF-MCG/0.5 IM SUSP: 0.5000 mL | Freq: Once | INTRAMUSCULAR | Status: DC

## 2019-06-27 MED ORDER — TRAMADOL HCL 50 MG PO TABS
50.0000 mg | ORAL_TABLET | Freq: Four times a day (QID) | ORAL | Status: DC | PRN
  Administered 2019-06-27: 50 mg via ORAL
  Filled 2019-06-27: qty 1

## 2019-06-27 MED ORDER — MEASLES, MUMPS & RUBELLA VAC IJ SOLR: 0.5000 mL | Freq: Once | INTRAMUSCULAR | Status: DC

## 2019-06-27 MED ORDER — OXYTOCIN 40 UNITS IN NORMAL SALINE INFUSION - SIMPLE MED
INTRAVENOUS | Status: DC | PRN
  Administered 2019-06-27: 40 [IU] via INTRAVENOUS

## 2019-06-27 MED ORDER — ACETAMINOPHEN 325 MG PO TABS: 650.0000 mg | ORAL_TABLET | ORAL | Status: DC | PRN

## 2019-06-27 MED ORDER — CEFAZOLIN SODIUM-DEXTROSE 2-4 GM/100ML-% IV SOLN
INTRAVENOUS | Status: AC
  Filled 2019-06-27: qty 100

## 2019-06-27 MED ORDER — OXYTOCIN 40 UNITS IN NORMAL SALINE INFUSION - SIMPLE MED: 2.5000 [IU]/h | INTRAVENOUS | Status: AC

## 2019-06-27 MED ORDER — MENTHOL 3 MG MT LOZG: 1.0000 | LOZENGE | OROMUCOSAL | Status: DC | PRN

## 2019-06-27 MED ORDER — OXYCODONE-ACETAMINOPHEN 5-325 MG PO TABS: 2.0000 | ORAL_TABLET | ORAL | Status: DC | PRN

## 2019-06-27 MED ORDER — SENNOSIDES-DOCUSATE SODIUM 8.6-50 MG PO TABS
2.0000 | ORAL_TABLET | ORAL | Status: DC
  Administered 2019-06-28 (×2): 2 via ORAL
  Filled 2019-06-27 (×2): qty 2

## 2019-06-27 MED ORDER — NALBUPHINE HCL 10 MG/ML IJ SOLN
5.0000 mg | INTRAMUSCULAR | Status: DC | PRN
  Administered 2019-06-27 – 2019-06-28 (×2): 5 mg via INTRAVENOUS
  Filled 2019-06-27 (×6): qty 0.5

## 2019-06-27 MED ORDER — FENTANYL CITRATE (PF) 100 MCG/2ML IJ SOLN: 100.0000 ug | Freq: Once | INTRAMUSCULAR | Status: DC

## 2019-06-27 MED ORDER — OXYTOCIN BOLUS FROM INFUSION: 500.0000 mL | Freq: Once | INTRAVENOUS | Status: DC

## 2019-06-27 MED ORDER — OXYTOCIN 40 UNITS IN NORMAL SALINE INFUSION - SIMPLE MED
1.0000 m[IU]/min | INTRAVENOUS | Status: DC
  Administered 2019-06-27: 2 m[IU]/min via INTRAVENOUS

## 2019-06-27 MED ORDER — DIBUCAINE (PERIANAL) 1 % EX OINT: 1.0000 "application " | TOPICAL_OINTMENT | CUTANEOUS | Status: DC | PRN

## 2019-06-27 MED ORDER — OXYCODONE HCL 5 MG PO TABS: 5.0000 mg | ORAL_TABLET | Freq: Once | ORAL | Status: DC | PRN

## 2019-06-27 SURGICAL SUPPLY — 30 items
CHLORAPREP W/TINT 26ML (MISCELLANEOUS) ×3 IMPLANT
CLAMP CORD UMBIL (MISCELLANEOUS) ×3 IMPLANT
CLOTH BEACON ORANGE TIMEOUT ST (SAFETY) ×3 IMPLANT
DRSG OPSITE POSTOP 4X10 (GAUZE/BANDAGES/DRESSINGS) ×3 IMPLANT
ELECT REM PT RETURN 9FT ADLT (ELECTROSURGICAL) ×3
ELECTRODE REM PT RTRN 9FT ADLT (ELECTROSURGICAL) ×1 IMPLANT
GLOVE BIOGEL PI IND STRL 7.0 (GLOVE) ×3 IMPLANT
GLOVE BIOGEL PI INDICATOR 7.0 (GLOVE) ×6
GLOVE ECLIPSE 7.0 STRL STRAW (GLOVE) ×3 IMPLANT
GOWN STRL REUS W/TWL LRG LVL3 (GOWN DISPOSABLE) ×6 IMPLANT
KIT ABG SYR 3ML LUER SLIP (SYRINGE) ×3 IMPLANT
NEEDLE HYPO 22GX1.5 SAFETY (NEEDLE) ×3 IMPLANT
NEEDLE HYPO 25X5/8 SAFETYGLIDE (NEEDLE) ×3 IMPLANT
NS IRRIG 1000ML POUR BTL (IV SOLUTION) ×3 IMPLANT
PACK C SECTION WH (CUSTOM PROCEDURE TRAY) ×3 IMPLANT
PAD ABD 7.5X8 STRL (GAUZE/BANDAGES/DRESSINGS) ×3 IMPLANT
PAD OB MATERNITY 4.3X12.25 (PERSONAL CARE ITEMS) ×3 IMPLANT
PENCIL SMOKE EVAC W/HOLSTER (ELECTROSURGICAL) ×3 IMPLANT
RTRCTR C-SECT PINK 25CM LRG (MISCELLANEOUS) IMPLANT
SUT PDS AB 0 CTX 36 PDP370T (SUTURE) IMPLANT
SUT PLAIN 2 0 (SUTURE) ×2
SUT PLAIN 2 0 XLH (SUTURE) IMPLANT
SUT PLAIN ABS 2-0 CT1 27XMFL (SUTURE) ×1 IMPLANT
SUT VIC AB 0 CTX 36 (SUTURE) ×4
SUT VIC AB 0 CTX36XBRD ANBCTRL (SUTURE) ×2 IMPLANT
SUT VIC AB 4-0 KS 27 (SUTURE) ×3 IMPLANT
SYR CONTROL 10ML LL (SYRINGE) ×3 IMPLANT
TOWEL OR 17X24 6PK STRL BLUE (TOWEL DISPOSABLE) ×3 IMPLANT
TRAY FOLEY W/BAG SLVR 14FR LF (SET/KITS/TRAYS/PACK) ×3 IMPLANT
WATER STERILE IRR 1000ML POUR (IV SOLUTION) ×3 IMPLANT

## 2019-06-27 NOTE — Progress Notes (Addendum)
Patient ID: Katrina May, female   DOB: June 13, 1996, 23 y.o.   MRN: [id number]  Labor Progress Note Katrina May is a 23 y.o. G2P0010 with IUP at [redacted]w[redacted]d presented for SOL S: Pt is resting in bed. Says contractions are moderately uncomfortable now. They don't feel very strong. She just ate a light lunch of some chicken broth. Wants IV pain meds when pain becomes severe.   O:  BP 132/89   Pulse 76   Temp 98.4 F (36.9 C) (Oral)   Resp 18   Ht 5' (1.524 m)   Wt 67.1 kg   LMP 09/19/2018 (Exact Date)   SpO2 100% Comment: room air   BMI 28.90 kg/m  EFM: 5/90/-1 to -2  CVE: Dilation: 5 Effacement (%): 90 Cervical Position: Middle Station: -1, -2 Presentation: Vertex Exam by:: Dr. Caron Presume   A&P: 23 y.o. G2P0010 with IUP [redacted]w[redacted]d presenting for SOL.  #Labor: Stage 1, latent. PROM at term. Starting pitocin for induction. #Pain: Tolerable now. Requests IV narcotics later for severe pain  #FWB: Category 1 strip #GBS negative Uncomplicated pregnancy  Aron Baba, Medical Student 2:00 PM

## 2019-06-27 NOTE — Op Note (Signed)
Katrina May PROCEDURE DATE: 06/27/2019  PREOPERATIVE DIAGNOSES: Intrauterine pregnancy at [redacted]w[redacted]d weeks gestation; non-reassuring fetal status  POSTOPERATIVE DIAGNOSES: The same  PROCEDURE: Urgent Primary Low Transverse Cesarean Section  SURGEON:  Dr. Verita Schneiders  ASSISTANTS:  Dr. Early Chars, Dr. York Ram  ANESTHESIOLOGY TEAM: Anesthesiologist: Lidia Collum, MD CRNA: Jonna Munro, CRNA; Hewitt Blade, CRNA; Rayvon Char, CRNA  INDICATIONS: Katrina May is a 23 y.o. G2P0010 at [redacted]w[redacted]d here for cesarean section secondary to fetal bradycardia; please see preoperative notes for further details. This was done as a Code Cesarean.  The patient was verbally consented for the procedure and concurred with the proposed plan.  FINDINGS:  Viable female infant in cephalic presentation.  Apgars 9 and 9, nuchal cord x 1 and baby also noted to be holding cord.  Clear amniotic fluid.  Intact placenta, three vessel cord.  Normal uterus, fallopian tubes and ovaries bilaterally.  ANESTHESIA: Epidural  INTRAVENOUS FLUIDS: 2500 ml   ESTIMATED BLOOD LOSS: 200 ml URINE OUTPUT:  300 ml SPECIMENS: Placenta sent to pathology COMPLICATIONS: None immediate   PROCEDURE IN DETAIL:  The patient was urgently taken to the the operating room her epidural anesthesia was dosed up to surgical level and was found to be adequate. She was then placed in a dorsal supine position with a leftward tilt, prepped quickly with betadine and draped in a sterile manner.  She already had a foley catheter in her bladder from L&D.  After a timeout was performed, a Pfannenstiel skin incision was made with scalpel and carried through to the underlying layer of fascia. The fascial incision was extended bilaterally in a blunt fashion.  The fascia was separated from underlying rectus muscles bluntly.  The rectus muscles were separated in the midline bluntly and the peritoneum was entered bluntly. Attention was turned  to the lower uterine segment where a low transverse hysterotomy was made with a scalpel and extended bilaterally bluntly.  The infant was successfully delivered, the cord was clamped and cut after one minute given that infant seemed to be stable, and the infant was handed over to awaiting neonatology team.  The placenta was delivered intact and had a three-vessel cord. The uterus was then cleared of clot and debris.  The hysterotomy was closed with 0 Vicryl in a running locked fashion, and an imbricating layer was also placed with 0 Vicryl. The pelvis was cleared of all clot and debris. Hemostasis was confirmed on all surfaces. The fascia was then closed using 0 Vicryl in a running fashion.   The subcutaneous layer was irrigated, reapproximated with 2-0 plain gut interrupted stitches, and the skin was closed with a 4-0 Vicryl subcuticular stitch. The patient tolerated the procedure well. Sponge, instrument and needle counts were correct x 3.  She was taken to the recovery room in stable condition.    Verita Schneiders, MD, [place] for [employer], [place]

## 2019-06-27 NOTE — MAU Note (Signed)
Pt reports contractions since 4am every 5-10 mins. Has been leaking for the past 2 days. Was evaluated and was told it was not her water, but patient reports it is heavier with some pink blood now. Reports good fetal movement all day, but has not felt baby move since she woke up at 4am. Cervix was 1-2 on last exam.

## 2019-06-27 NOTE — Transfer of Care (Signed)
Immediate Anesthesia Transfer of Care Note  Patient: Katrina May  Procedure(s) Performed: CESAREAN SECTION (N/A Abdomen)  Patient Location: PACU  Anesthesia Type:Epidural  Level of Consciousness: awake, alert  and oriented  Airway & Oxygen Therapy: Patient Spontanous Breathing  Post-op Assessment: Report given to RN and Post -op Vital signs reviewed and stable  Post vital signs: Reviewed and stable  Last Vitals:  Vitals Value Taken Time  BP    Temp    Pulse    Resp    SpO2      Last Pain:  Vitals:   06/27/19 1600  TempSrc: Oral  PainSc:          Complications: No apparent anesthesia complications

## 2019-06-27 NOTE — MAU Note (Signed)
Patient reports that she has felt fetal movement since arrival to MAU.

## 2019-06-27 NOTE — Anesthesia Preprocedure Evaluation (Addendum)
Anesthesia Evaluation  Patient identified by MRN, date of birth, ID band Patient awake    Reviewed: Allergy & Precautions, H&P , NPO status , Patient's Chart, lab work & pertinent test results  History of Anesthesia Complications Negative for: history of anesthetic complications  Airway Mallampati: II  TM Distance: >3 FB Neck ROM: full    Dental no notable dental hx.    Pulmonary neg pulmonary ROS,    Pulmonary exam normal        Cardiovascular negative cardio ROS Normal cardiovascular exam Rhythm:regular Rate:Normal     Neuro/Psych negative neurological ROS  negative psych ROS   GI/Hepatic negative GI ROS, Neg liver ROS,   Endo/Other  negative endocrine ROS  Renal/GU negative Renal ROS  negative genitourinary   Musculoskeletal   Abdominal   Peds  Hematology thrombocytopenia   Anesthesia Other Findings   Reproductive/Obstetrics (+) Pregnancy                             Anesthesia Physical Anesthesia Plan  ASA: II and emergent  Anesthesia Plan: Epidural   Post-op Pain Management:    Induction:   PONV Risk Score and Plan:   Airway Management Planned:   Additional Equipment:   Intra-op Plan:   Post-operative Plan:   Informed Consent: I have reviewed the patients History and Physical, chart, labs and discussed the procedure including the risks, benefits and alternatives for the proposed anesthesia with the patient or authorized representative who has indicated his/her understanding and acceptance.       Plan Discussed with:   Anesthesia Plan Comments: (Stat C/S for fetal bradycardia)       Anesthesia Quick Evaluation

## 2019-06-27 NOTE — Discharge Summary (Signed)
OB Discharge Summary     Patient Name: Katrina May DOB: 02/27/1996 MRN: [id number]  Date of admission: 06/27/2019 Delivering MD: Vassie Moment  Date of discharge: 06/29/2019  Admitting diagnosis: 40 wk ctx leaking and bleeding Intrauterine pregnancy: [redacted]w[redacted]d     Secondary diagnosis:  Principal Problem:   S/P emergency cesarean section  Additional problems: intrapartum HTN     Discharge diagnosis: Term Pregnancy Delivered                                                                                                Post partum procedures:none  Augmentation: Pitocin  Complications: None  Hospital course:  Onset of Labor With Unplanned C/S  23 y.o. yo G2P0010 at [redacted]w[redacted]d was admitted in Latent Labor on 06/27/2019. Patient had a labor course significant for SROM without progression of cervix, started on Pitocin. Onset of intrapartum HTN with neg PEC labs. Spontaneous Membrane Rupture Time/Date: 0400 on 05/27/19  The patient went for cesarean section due to fetal bradycardia after epidural not responsive to terbutaline, and delivered a Viable infant,06/27/2019  Details of operation can be found in separate operative note. Patient had an uncomplicated postpartum course.  She is ambulating, tolerating a regular diet, passing flatus, and urinating well.  Patient is discharged home in stable condition 06/29/19. Depo Provera ordered.  Physical exam  Vitals:   06/28/19 1130 06/28/19 1420 06/28/19 2244 06/29/19 0519  BP: 120/65 128/76 125/60 123/84  Pulse: 80 85 96 (!) 105  Resp: 14 14 15 17   Temp: 98.5 F (36.9 C) 99 F (37.2 C) 99 F (37.2 C) 98.2 F (36.8 C)  TempSrc: Oral Oral Oral Oral  SpO2: 97% 96% 98% 98%  Weight:      Height:       General: alert, cooperative and no distress Lochia: appropriate Uterine Fundus: firm Incision: Healing well with no significant drainage, No significant erythema, Dressing is clean, dry, and intact DVT Evaluation: No evidence of DVT  seen on physical exam. Negative Homan's sign. No cords or calf tenderness. No significant calf/ankle edema. Labs: Lab Results  Component Value Date   WBC 15.5 (H) 06/28/2019   HGB 9.4 (L) 06/28/2019   HCT 28.3 (L) 06/28/2019   MCV 87.6 06/28/2019   PLT 118 (L) 06/28/2019   CMP Latest Ref Rng & Units 06/28/2019  Glucose 70 - 99 mg/dL 137(H)  BUN 6 - 20 mg/dL 5(L)  Creatinine 0.44 - 1.00 mg/dL 0.64  Sodium 135 - 145 mmol/L 137  Potassium 3.5 - 5.1 mmol/L 4.4  Chloride 98 - 111 mmol/L 107  CO2 22 - 32 mmol/L 20(L)  Calcium 8.9 - 10.3 mg/dL 8.7(L)  Total Protein 6.5 - 8.1 g/dL 5.2(L)  Total Bilirubin 0.3 - 1.2 mg/dL 0.2(L)  Alkaline Phos 38 - 126 U/L 104  AST 15 - 41 U/L 24  ALT 0 - 44 U/L 11    Discharge instruction: per After Visit Summary and "Baby and Me Booklet".  After visit meds:  Allergies as of 06/29/2019   No Known Allergies     Medication List  STOP taking these medications   Comfort Fit Maternity Supp Sm Misc     TAKE these medications   gabapentin 100 MG capsule Commonly known as: NEURONTIN Take 1 capsule (100 mg total) by mouth 3 (three) times daily.   ibuprofen 800 MG tablet Commonly known as: ADVIL Take 1 tablet (800 mg total) by mouth every 6 (six) hours.   oxyCODONE 5 MG immediate release tablet Commonly known as: Oxy IR/ROXICODONE Take 1-2 tablets (5-10 mg total) by mouth every 6 (six) hours as needed for severe pain.   prenatal multivitamin Tabs tablet Take 1 tablet by mouth daily with lunch.       Diet: routine diet  Activity: Advance as tolerated. Pelvic rest for 6 weeks.   Outpatient follow up:1 week for BP and incision check Follow up Appt: Future Appointments  Date Time Provider Department Center  07/05/2019  2:30 PM [facility] NURSE [facility]OC  07/12/2019  1:30 PM [facility] NURSE [facility]  07/26/2019  3:35 PM Fair, Hoyle Sauer, MD [facility]   Follow up Visit:No follow-ups on file.  Postpartum contraception: Depo  Provera  Newborn Data: Live born female  Birth Weight: 7'4 APGAR: 8, 9  Newborn Delivery   Birth date/time: 06/27/2019 17:49:00 Delivery type: C-Section, Low Transverse C-section categorization: Primary     Baby Feeding: Breast Disposition:home with mother  06/29/2019 Donette Larry, CNM

## 2019-06-27 NOTE — Progress Notes (Addendum)
Was called to patient's room after she received an epidural.  The heart rate had a new baseline rate 100 but she was having prolonged decelerations in the 70s and 80s.  We tried multiple positions including hands and knees and applied it a fetal scalp monitor.  IV fluid bolus was given, TERB was given.  Dr. Harolyn Rutherford was called to the room and discussed a cesarean section.  She called a code cesarean and the patient is transferred to the OR.   York Ram, MD PGY1 Resident    Attestation of Attending Supervision of Resident: Evaluation and management procedures were performed by the [facility] Medicine Resident under my supervision. I was immediately available for direct supervision, assistance and direction throughout this encounter.  I also confirm that I have verified the information documented in the resident's brief note.  I evaluated the FHR tracing, fetal bradycardia noted, not responsive to intrauterine neonatal resuscitative efforts for over 10 minutes. Code Cesarean called, patient verbally consented for procedure.    Verita Schneiders, MD, [place] Attending [place], [facility] for [employer], [place]

## 2019-06-27 NOTE — H&P (Addendum)
OBSTETRIC ADMISSION HISTORY AND PHYSICAL  Katrina KarvonenKelsey May is a 23 y.o. female G2P0010 with IUP at 6316w1d presenting for labor. Says she started leaking some clear fluid yesterday. This became more pronounced at ~0400 when she woke up this morning, with more profuse and pink leakage. Contractions started at ~0400 this morning, about q495min, with pain 8/10 in severity then. Now she says they are happening ~q210min, 6/10 severity. She reports +FM in MAU this morning. No VB, blurry vision, headaches. Some mild foot and ankle swelling bilaterally. No RUQ pain. She plans on both breast and bottle feeding. She is undecided about method of birth control.  Dating: By LMP --->  Estimated Date of Delivery: 06/26/19  Sono:   Anatomy scan at @[redacted]w[redacted]d , CWD, normal anatomy, cephalic presentation, 273 g, 09%WJX42%ile, EFW 10 oz  Prenatal History/Complications: Low-risk pregnancy without complications per patient and chart review   Past Medical History: Past Medical History:  Diagnosis Date  . Medical history non-contributory     Past Surgical History: Past Surgical History:  Procedure Laterality Date  . NO PAST SURGERIES      Obstetrical History: OB History    Gravida  2   Para      Term      Preterm      AB  1   Living  0     SAB  1   TAB      Ectopic      Multiple      Live Births  0           Social History: Social History   Socioeconomic History  . Marital status: Single    Spouse name: Not on file  . Number of children: Not on file  . Years of education: Not on file  . Highest education level: Not on file  Occupational History    Comment: unemployed  Social Needs  . Financial resource strain: Not on file  . Food insecurity    Worry: Never true    Inability: Never true  . Transportation needs    Medical: No    Non-medical: No  Tobacco Use  . Smoking status: Never Smoker  . Smokeless tobacco: Never Used  Substance and Sexual Activity  . Alcohol use: Never     Frequency: Never  . Drug use: Never  . Sexual activity: Yes    Birth control/protection: None  Lifestyle  . Physical activity    Days per week: Not on file    Minutes per session: Not on file  . Stress: Not on file  Relationships  . Social Musicianconnections    Talks on phone: Not on file    Gets together: Not on file    Attends religious service: Not on file    Active member of club or organization: Not on file    Attends meetings of clubs or organizations: Not on file    Relationship status: Not on file  Other Topics Concern  . Not on file  Social History Narrative   06/27/2019:    Pt stays at home, not working or in school presently    Born in [place], moved to [place] ~2 yrs ago for a change of scene   No issues w housing, food, transpo, finances    Has some fam in [place] area, feels she has a good support system   Has three siblings   Mom lives in [place], will visit her & baby   No substance use during pregnancy  Eats full diet with meat, veg, fruits, grains, etc    Family History: Family History  Problem Relation Age of Onset  . Diabetes Paternal Grandfather   . Healthy Mother   . Healthy Father    Pt reports FHx of appendicitis in dad, one brother, PGP, paternal uncles appendicitis   Allergies: No Known Allergies  Medications Prior to Admission  Medication Sig Dispense Refill Last Dose  . Elastic Bandages & Supports (COMFORT FIT MATERNITY SUPP SM) MISC 1 Units by Does not apply route daily as needed. 1 each 0   . metroNIDAZOLE (FLAGYL) 500 MG tablet Take 1 tablet (500 mg total) by mouth 2 (two) times daily for 7 days. (Patient not taking: Reported on 06/24/2019) 14 tablet 0   . Prenatal Vit-Fe Fumarate-FA (PRENATAL MULTIVITAMIN) TABS tablet Take 1 tablet by mouth daily at 12 noon.      Review of Systems   All systems reviewed and negative except as stated in HPI  Blood pressure 133/85, pulse 80, temperature (!) 97.5 F (36.4 C), temperature source Oral, resp. rate 18, height 5'  (1.524 m), weight 67.1 kg, last menstrual period 09/19/2018, SpO2 100 %. General appearance: alert, cooperative and no distress Lungs: regular rate and effort, CTAB Heart: regular rate and rhythm, nl S1S2, no MRG Abdomen: soft, non-tender Extremities: Homans sign is negative, no sign of DVT Presentation: cephalic Fetal monitoringBaseline: 120 bpm, Variability: Good {> 6 bpm), Accelerations: Reactive and Decelerations: Absent Uterine activityDate/time of onset: 06/27/2019, Frequency: Every 10 minutes and Intensity: moderate Dilation: 4(Simultaneous filing. User may not have seen previous data.) Effacement (%): 90(Simultaneous filing. User may not have seen previous data.) Station: -Photographer. User may not have seen previous data.) Exam by:: Steward Drone CNM(Simultaneous filing. User may not have seen previous data.)  Prenatal labs: ABO, Rh: --/--/O POS (11/08 1610) Antibody: NEG (11/08 0846) Rubella: 3.64 (05/07 1547) RPR: Reactive (08/14 0902)  HBsAg: Negative (05/07 1547)  HIV: Non Reactive (08/14 0902)  GBS: Negative/-- (10/14 1548)  2 hr GTT 107 on 04/02/2019  Prenatal Transfer Tool  Maternal Diabetes: No Genetic Screening: ordered, not completed Maternal Ultrasounds/Referrals: Normal Fetal Ultrasounds or other Referrals:  None Maternal Substance Abuse:  No Significant Maternal Medications:  None Significant Maternal Lab Results: None and Other: RPR false positive  Results for orders placed or performed during the hospital encounter of 06/27/19 (from the past 24 hour(s))  POCT fern test   Collection Time: 06/27/19  6:43 AM  Result Value Ref Range   POCT Fern Test Negative = intact amniotic membranes   Amnisure rupture of membrane (rom)not at [facility]   Collection Time: 06/27/19  6:50 AM  Result Value Ref Range   Amnisure ROM NEGATIVE   Type and screen [facility]   Collection Time: 06/27/19  8:46 AM  Result Value Ref Range   ABO/RH(D) O POS     Antibody Screen NEG    Sample Expiration      06/30/2019,2359 Performed at [facility], [address], [place], [place] [address]   CBC   Collection Time: 06/27/19  8:50 AM  Result Value Ref Range   WBC 10.1 4.0 - 10.5 K/uL   RBC 3.84 (L) 3.87 - 5.11 MIL/uL   Hemoglobin 11.2 (L) 12.0 - 15.0 g/dL   HCT 40.9 (L) 81.1 - 91.4 %   MCV 89.8 80.0 - 100.0 fL   MCH 29.2 26.0 - 34.0 pg   MCHC 32.5 30.0 - 36.0 g/dL  RDW 14.0 11.5 - 15.5 %   Platelets 134 (L) 150 - 400 K/uL   nRBC 0.0 0.0 - 0.2 %    Patient Active Problem List   Diagnosis Date Noted  . Normal labor and delivery 06/27/2019  . Supervision of low-risk pregnancy 12/08/2018    Assessment: Katrina May is a 23 y.o. G2P0010 at [redacted]w[redacted]d with IUP here for SOL after clear/pink LOF fluid at ~0400 this morning  1. Labor: Stage 1 latent, progressing well  2. FWB: Category 1 tracing, reassuring 3. Pain: pt prefers natural w no anesthesia; anesthesia / analgesia prn if needed 4. GBS: negative   Plan: Admit to L&D, anticipate SVD  - expectant management; no need for augmentation now - MOF: breast and bottle - MOC: undecided - Circ: no  Aron Baba, Medical Student  06/27/2019, 10:31 AM   Midwife attestation: I have seen and examined this patient; I agree with above documentation in the studetn's note.   PE: Gen: calm comfortable, NAD Resp: normal effort and rate Abd: gravid  ROS, labs, PMH reviewed  Assessment/Plan: Shantrice Rodenberg is a 23 y.o. G2P0010 here for SROM (confirmend in MAU)  Admit to LD Labor: latent FWB: Cat I GBS neg Expectant mngt  Julianne Handler, CNM  06/27/2019, 2:19 PM

## 2019-06-27 NOTE — Anesthesia Procedure Notes (Signed)
Epidural Patient location during procedure: OB Start time: 06/27/2019 5:04 PM End time: 06/27/2019 5:19 PM  Staffing Anesthesiologist: Lidia Collum, MD Performed: anesthesiologist   Preanesthetic Checklist Completed: patient identified, pre-op evaluation, timeout performed, IV checked, risks and benefits discussed and monitors and equipment checked  Epidural Patient position: sitting Prep: DuraPrep Patient monitoring: heart rate, continuous pulse ox and blood pressure Approach: midline Location: L3-L4 Injection technique: LOR air  Needle:  Needle type: Tuohy  Needle gauge: 17 G Needle length: 9 cm Needle insertion depth: 6 cm Catheter type: closed end flexible Catheter size: 19 Gauge Catheter at skin depth: 11 cm Test dose: negative  Assessment Events: blood not aspirated, injection not painful, no injection resistance, negative IV test and no paresthesia  Additional Notes Reason for block:procedure for pain

## 2019-06-27 NOTE — Plan of Care (Signed)

## 2019-06-27 NOTE — Progress Notes (Addendum)
Pt c/o 5/10 incisional pain. Had IV toradol at [place]. Called Dr. Christella Hartigan as pt only has narcotics order prn for pain and is <12 hours since duramorph administration. Per Dr. Christella Hartigan, okay for pt to have pain medication that is ordered, start with ultram and try to stick to po pain meds if possible.

## 2019-06-27 NOTE — Anesthesia Postprocedure Evaluation (Signed)
Anesthesia Post Note  Patient: Katrina May  Procedure(s) Performed: CESAREAN SECTION (N/A Abdomen)     Patient location during evaluation: PACU Anesthesia Type: Epidural Level of consciousness: oriented and awake and alert Pain management: pain level controlled Vital Signs Assessment: post-procedure vital signs reviewed and stable Respiratory status: spontaneous breathing, respiratory function stable and nonlabored ventilation Cardiovascular status: blood pressure returned to baseline and stable Postop Assessment: no headache, no backache, no apparent nausea or vomiting and epidural receding Anesthetic complications: no    Last Vitals:  Vitals:   06/27/19 2020 06/27/19 2130  BP: 124/77 126/75  Pulse: 74 76  Resp: 18 20  Temp: 36.7 C 36.7 C  SpO2: 98% 93%    Last Pain:  Vitals:   06/27/19 2130  TempSrc: Oral  PainSc: 5    Pain Goal: Patients Stated Pain Goal: 3 (06/27/19 2130)              Epidural/Spinal Function Cutaneous sensation: Able to Wiggle Toes (06/27/19 2130), Patient able to flex knees: Yes (06/27/19 2130), Patient able to lift hips off bed: Yes (06/27/19 2130), Back pain beyond tenderness at insertion site: No (06/27/19 2130), Progressively worsening motor and/or sensory loss: No (06/27/19 2130), Bowel and/or bladder incontinence post epidural: No (06/27/19 2130)  Lidia Collum

## 2019-06-28 ENCOUNTER — Encounter (HOSPITAL_COMMUNITY): Payer: Self-pay | Admitting: *Deleted

## 2019-06-28 LAB — COMPREHENSIVE METABOLIC PANEL
ALT: 11 U/L (ref 0–44)
AST: 24 U/L (ref 15–41)
Albumin: 2 g/dL — ABNORMAL LOW (ref 3.5–5.0)
Alkaline Phosphatase: 104 U/L (ref 38–126)
Anion gap: 10 (ref 5–15)
BUN: 5 mg/dL — ABNORMAL LOW (ref 6–20)
CO2: 20 mmol/L — ABNORMAL LOW (ref 22–32)
Calcium: 8.7 mg/dL — ABNORMAL LOW (ref 8.9–10.3)
Chloride: 107 mmol/L (ref 98–111)
Creatinine, Ser: 0.64 mg/dL (ref 0.44–1.00)
GFR calc Af Amer: >60 mL/min (ref >60)
GFR calc non Af Amer: >60 mL/min (ref >60)
Glucose, Bld: 137 mg/dL — ABNORMAL HIGH (ref 70–99)
Potassium: 4.4 mmol/L (ref 3.5–5.1)
Sodium: 137 mmol/L (ref 135–145)
Total Bilirubin: 0.2 mg/dL — ABNORMAL LOW (ref 0.3–1.2)
Total Protein: 5.2 g/dL — ABNORMAL LOW (ref 6.5–8.1)

## 2019-06-28 LAB — CBC
HCT: 28.3 % — ABNORMAL LOW (ref 36.0–46.0)
Hemoglobin: 9.4 g/dL — ABNORMAL LOW (ref 12.0–15.0)
MCH: 29.1 pg (ref 26.0–34.0)
MCHC: 33.2 g/dL (ref 30.0–36.0)
MCV: 87.6 fL (ref 80.0–100.0)
Platelets: 118 10*3/uL — ABNORMAL LOW (ref 150–400)
RBC: 3.23 MIL/uL — ABNORMAL LOW (ref 3.87–5.11)
RDW: 13.9 % (ref 11.5–15.5)
WBC: 15.5 10*3/uL — ABNORMAL HIGH (ref 4.0–10.5)
nRBC: 0 % (ref 0.0–0.2)

## 2019-06-28 LAB — T.PALLIDUM AB, TOTAL: T Pallidum Abs: NONREACTIVE

## 2019-06-28 MED ORDER — SODIUM CHLORIDE 0.9 % IV SOLN
510.0000 mg | Freq: Once | INTRAVENOUS | Status: AC
  Administered 2019-06-28: 510 mg via INTRAVENOUS
  Filled 2019-06-28: qty 17

## 2019-06-28 NOTE — Progress Notes (Addendum)
Subjective: Postpartum Day 1: Cesarean Delivery for fetal bradycardia Patient reports that around 0100 she had some dizziness upon standing to go to the bathroom. She has been able to sit on the edge of the bed without feeling dizzy. She denies SOB, HA, vision changes or incisional pain. She denies nausea/vomiting.    Objective: Vital signs in last 24 hours: Temp:  [96.1 F (35.6 C)-98.7 F (37.1 C)] 98.7 F (37.1 C) (11/09 0515) Pulse Rate:  [67-171] 96 (11/09 0515) Resp:  [13-24] 18 (11/09 0515) BP: (91-166)/(34-98) 112/69 (11/09 0515) SpO2:  [93 %-100 %] 97 % (11/09 0515) Weight:  [67.1 kg] 67.1 kg (11/08 0913)  Physical Exam:  General: alert and no distress Lochia: appropriate Uterine Fundus: firm Incision: healing well, no dehiscence, no significant erythema, small amount of old blood noted on honeycomb dressing DVT Evaluation: No evidence of DVT seen on physical exam. Negative Homan's sign. No cords or calf tenderness. No significant calf/ankle edema.  Recent Labs    06/27/19 1637 06/28/19 0523  HGB 11.6* 9.4*  HCT 35.1* 28.3*    Assessment/Plan: Status post Cesarean section. Doing well postoperatively.  Continue iron supplementation. Plan to get patient up to bathroom and remove foley catheter later this morning.  Contraception: undecided. Pt is considering Depo or Nexplanon.  Circumcision: no  Renee Harder, SNM 06/28/2019, 7:39 AM   Provider attestation I have seen and examined this patient and agree with above documentation in the midwife student's note.   Post Partum Day 1  Katrina May is a 23 y.o. G2P1011 s/p LTCS.  Pt reports problems with ambulating. Gets dizzy when she stands up. Pain is well controlled. Method of Feeding: breast  PE:  Gen: well appearing Heart: reg rate Lungs: normal WOB Fundus firm Ext: soft, no pain, no edema  Assessment: S/p LTCS PPD #1  Plan for discharge: tomorrow -will give IV feraheme today   Jorje Guild,  NP 11:55 AM

## 2019-06-28 NOTE — Lactation Note (Signed)
This note was copied from a baby's chart. Lactation Consultation Note Baby 8 hrs old, not feeding well. Mom has asked for formula. Mom had c/section, sleepy, limited movement. Mom has wide space breast, semi flat nipples. Slightly compressible w/stimulation. Encouraged mom to stimulate to evert nipple more. Attempted to latch baby. Baby unable to feel nipple, wouldn't latch. Fitted mom #20 NS. Taught c/hold. Baby latched well w/much guidance and teaching. Baby held in mouth for a little bit then started suckling. BF approx. 5 min. A few dots of colostrum noted in NS. Baby became fussy pushing away, occasionally gagging. Encouraged mom to talk to baby during feeding. Mom has long artifical nails. Newborn behavior, STS, I&O, breast massage, positioning, support, supply and demand discussed. Mom encouraged to feed baby 8-12 times/24 hours and with feeding cues.  Encouraged mom to wear shells in am. LC isn't sure how committed mom is to BF. Support person isn't supportive at all.  Encouraged to call for assistance or questions. Lactation brochure given in [place] per mom request. No interpreter needed, mom speaks good [place].  Patient Name: Katrina May [id number] Date: 06/28/2019 Reason for consult: Initial assessment;Primapara;Term   Maternal Data Has patient been taught Hand Expression?: Yes Does the patient have breastfeeding experience prior to this delivery?: No  Feeding Feeding Type: Breast Fed  LATCH Score Latch: Repeated attempts needed to sustain latch, nipple held in mouth throughout feeding, stimulation needed to elicit sucking reflex.  Audible Swallowing: None  Type of Nipple: Flat(semi flat)  Comfort (Breast/Nipple): Soft / non-tender  Hold (Positioning): Full assist, staff holds infant at breast  LATCH Score: 4  Interventions Interventions: Breast feeding basics reviewed;Adjust position;Assisted with latch;Support pillows;Skin to skin;Position options;Breast  massage;Hand express;Breast compression;Hand pump;Pre-pump if needed;Shells  Lactation Tools Discussed/Used Tools: Shells;Pump;Nipple Shields Nipple shield size: 20 Shell Type: Inverted Breast pump type: Manual WIC Program: Yes Pump Review: Setup, frequency, and cleaning;Milk Storage Initiated by:: RN Date initiated:: 06/27/19   Consult Status Consult Status: Follow-up Date: 06/28/19 Follow-up type: In-patient    Geovonni Meyerhoff, [place]ta [place] 06/28/2019, 2:17 AM

## 2019-06-29 MED ORDER — GABAPENTIN 100 MG PO CAPS: 100.0000 mg | ORAL_CAPSULE | Freq: Three times a day (TID) | ORAL | 0 refills | Status: DC

## 2019-06-29 MED ORDER — IBUPROFEN 800 MG PO TABS: 800.0000 mg | ORAL_TABLET | Freq: Four times a day (QID) | ORAL | 0 refills | Status: DC

## 2019-06-29 MED ORDER — OXYCODONE HCL 5 MG PO TABS: 5.0000 mg | ORAL_TABLET | Freq: Four times a day (QID) | ORAL | 0 refills | Status: DC | PRN

## 2019-06-29 MED ORDER — MEDROXYPROGESTERONE ACETATE 150 MG/ML IM SUSP: 150.0000 mg | Freq: Once | INTRAMUSCULAR | Status: DC

## 2019-06-29 NOTE — Lactation Note (Signed)
This note was copied from a baby's chart. Lactation Consultation Note  Patient Name: Katrina May [id number] Date: 06/29/2019 Reason for consult: Follow-up assessment;Difficult latch Baby is 42 hours old/3% weight loss.  [place] worked with mom last night.  Mom states baby is latching with nipple shield.  She has talked to [facility] this morning and will obtain a DEBP.  Stressed importance of pumping every 3 hours.  Mom will supplement with formula until milk comes to volume.  Discussed milk coming to volume and the prevention and treatment of engorgement.  Reviewed outpatient services and encouraged to call.  Maternal Data    Feeding    LATCH Score                   Interventions    Lactation Tools Discussed/Used     Consult Status Consult Status: Complete Follow-up type: Call as needed    Ave Filter 06/29/2019, 12:06 PM

## 2019-06-29 NOTE — Discharge Instructions (Signed)

## 2019-06-29 NOTE — Lactation Note (Signed)
This note was copied from a baby's chart. Lactation Consultation Note Baby 73 hrs old. Baby fussy on the breast. Mom BF w/#20 NS. No transfer noted in NS. Watched mom latch. Mom using scissor hold for latching. Encouraged mom previous night not to do that d/t causes finger to be in the way of latching. Encouraged "C" hold, not touching areola. Mom placed NS on correctly. Encouraged breast massage during feeding. Noted baby's lips are not flanged but pushed inside by NS. This could be why there is no transfer. Rt. Breast does feel softer than Lt. Breast but no colostrum noted in NS. DRY. Encouraged to flange lips. Mom denies painful latches.  Breast are filling. Encouraged to pump. Mom not using hand pump. discussed the need for pumping. Mom agreed. Mom shown how to use DEBP & how to disassemble, clean, & reassemble parts. Mom knows to pump q3h for 15-20 min.  Baby feeding. No swallows heard. Discussed mom to pump then hand express give colostrum.  Mom has flat cone shaped breast. Encouraged to wear shells today. Mom hasn't worn them or pumped.  Encouraged mom to cont. To supplement after feeding.  Mom plans at home are to BF/pump and bottle feed. Since mom is using NS and planning to bottle feed, LC gave mom nipples to supplement with instead of using curve tip syring. Mom thankful for assistance.  FOB is not supportive at all the past 2 nights.  Mom needs to be seen by [facility] again before d/c home please.   Patient Name: Katrina May Date: 06/29/2019 Reason for consult: Follow-up assessment;Primapara;Term   Maternal Data Has patient been taught Hand Expression?: Yes Does the patient have breastfeeding experience prior to this delivery?: No  Feeding Feeding Type: Formula  LATCH Score Latch: Grasps breast easily, tongue down, lips flanged, rhythmical sucking.  Audible Swallowing: None  Type of Nipple: Flat  Comfort (Breast/Nipple): Soft / non-tender  Hold  (Positioning): Assistance needed to correctly position infant at breast and maintain latch.  LATCH Score: 6  Interventions Interventions: Breast feeding basics reviewed;Adjust position;DEBP;Assisted with latch;Support pillows;Breast massage;Pre-pump if needed;Breast compression  Lactation Tools Discussed/Used Tools: Pump;Shells;Nipple Shields(hasn't worn shells yet) Nipple shield size: 20 Shell Type: Inverted Breast pump type: Double-Electric Breast Pump;Manual WIC Program: Yes Pump Review: Setup, frequency, and cleaning;Milk Storage Initiated by:: Allayne Stack RN IBCLC Date initiated:: 06/29/19   Consult Status Consult Status: Follow-up Date: 06/29/19 Follow-up type: In-patient    Keryn Nessler, Elta Gu[place] 06/29/2019, 5:06 AM

## 2019-06-30 LAB — SURGICAL PATHOLOGY

## 2019-07-01 ENCOUNTER — Encounter: Payer: Medicaid Other | Admitting: Obstetrics and Gynecology

## 2019-07-03 ENCOUNTER — Inpatient Hospital Stay (HOSPITAL_COMMUNITY)
Admission: AD | Admit: 2019-07-03 | Payer: Medicaid Other | Source: Home / Self Care | Admitting: Obstetrics and Gynecology

## 2019-07-03 ENCOUNTER — Inpatient Hospital Stay (HOSPITAL_COMMUNITY): Payer: Medicaid Other

## 2019-07-05 ENCOUNTER — Other Ambulatory Visit: Payer: Self-pay

## 2019-07-05 ENCOUNTER — Ambulatory Visit (INDEPENDENT_AMBULATORY_CARE_PROVIDER_SITE_OTHER): Payer: Medicaid Other | Admitting: *Deleted

## 2019-07-05 VITALS — BP 132/78 | HR 78 | Ht 60.0 in | Wt 139.6 lb

## 2019-07-05 DIAGNOSIS — Z98891 History of uterine scar from previous surgery: Secondary | ICD-10-CM

## 2019-07-05 DIAGNOSIS — Z0130 Encounter for examination of blood pressure without abnormal findings: Secondary | ICD-10-CM

## 2019-07-05 MED ORDER — HYDROCHLOROTHIAZIDE 25 MG PO TABS: 25.0000 mg | ORAL_TABLET | Freq: Every day | ORAL | 0 refills | Status: DC

## 2019-07-05 NOTE — Progress Notes (Signed)
Chart reviewed - agree with RN documentation.   

## 2019-07-05 NOTE — Progress Notes (Signed)
Pt here for BP check due to intrapartum HTN.. She is s/p emergent C/S on 11/8 due to fetal bradycardia. She reports occasional H/A's which are relieved by ibuprofen - none today. She denies having any visual disturbances. Pt expressed concern for edema of both feet and ankles, primarily on Lt foot as well as small blisters which have developed on Lt foot. Exam per Dr. Nehemiah Settle - HCTZ prescribed until pt returns for PP visit. Pt is breastfeeding and she was advised to maintain adequate hydration in order for optimal production of breastmilk. C/S incision check performed and found to have honeycomb dressing still in place. When removed, incision was found to be well-healed and was without swelling, redness, bleeding or drainage. Incision care discussed and pt voiced understanding of all information and instructions given. PP visit scheduled on 07/26/19.

## 2019-07-12 ENCOUNTER — Ambulatory Visit: Payer: Medicaid Other

## 2019-07-13 ENCOUNTER — Telehealth: Payer: Self-pay

## 2019-07-13 DIAGNOSIS — F419 Anxiety disorder, unspecified: Secondary | ICD-10-CM

## 2019-07-13 NOTE — Telephone Encounter (Addendum)
Received call from [facility] from [facility] and was informed that she made a visit to pt's home and the pt stated that she was having some anxiety.  Sharyn Lull also stated that pt scored a 9 on [place].   Called pt and pt informed me that she has been feeling a little anxious.  I confirmed with pt if she wanted to be prescribed something or did she just want to speak to our [place].  Pt requested to have something prescribed.  Notified Kerry Hough, PA who agreed to prescribed antianxiety medication that worked for pt in the past due to her hx of anxiety for two weeks until she is able to see Roselyn Reef on 07/27/19.  I asked pt what has she taken in the past that worked for her.  Pt informs me that she has never taken any medication for anxiety.  I explained to the pt that I would need to speak with our [place] Clinician to see if I can get a sooner appt because we would not know what medication would be best to prescribe since she has not taken anything.  Pt verbalized understanding.

## 2019-07-14 NOTE — BH Specialist Note (Signed)
Integrated Behavioral Health via Telemedicine Phone Visit  07/14/2019 Katrina May [id number]  Number of Integrated Behavioral Health visits: 1 Session Start time: 3:24  Session End time: 3:57 Total time: 33  Referring Provider: Vonzella Nipple, PA-C Type of Visit: Phone Patient/Family location: Home [facility] Provider location: [facility] All persons participating in visit: Patient Katrina May and [name] [name]    Confirmed patient's address: Yes  Confirmed patient's phone number: Yes  Any changes to demographics: No   Confirmed patient's insurance: Yes  Any changes to patient's insurance: No   Discussed confidentiality: Yes   I connected with [name] by a phone enabled telemedicine application and verified that I am speaking with the correct person using two identifiers.     I discussed the limitations of evaluation and management by telemedicine and the availability of in person appointments.  I discussed that the purpose of this visit is to provide behavioral health care while limiting exposure to the novel coronavirus.   Discussed there is a possibility of technology failure and discussed alternative modes of communication if that failure occurs.  I discussed that engaging in this phone visit, they consent to the provision of behavioral healthcare and the services will be billed under their insurance.  Patient and/or legal guardian expressed understanding and consented to video visit: Yes   PRESENTING CONCERNS: Patient and/or family reports the following symptoms/concerns: Pt states her primary concern today is escalating feelings of depression and anxiety postpartum; pt has a history of self-harm via cutting, with no SI. Pt has limited local social support, as most of family is in [place].  Duration of problem: Increase perinatal; Severity of problem: moderate  STRENGTHS (Protective Factors/Coping Skills): Open to treatment  GOALS ADDRESSED: Patient will: 1.  Reduce  symptoms of: anxiety, depression and stress  2.  Increase knowledge and/or ability of: healthy habits and self-management skills  3.  Demonstrate ability to: Increase healthy adjustment to current life circumstances, Increase adequate support systems for patient/family and Increase motivation to adhere to plan of care  INTERVENTIONS: Interventions utilized:  Functional Assessment of ADLs, Psychoeducation and/or Health Education and Link to [employer] Standardized Assessments completed: GAD-7 and PHQ 9  ASSESSMENT: Patient currently experiencing Adjustment disorder with mixed anxiety and depressed mood.   Patient may benefit from psychoeducation and brief therapeutic interventions regarding coping with symptoms of depression and anxiety .  PLAN: 1. Follow up with behavioral health clinician on : One week 2. Behavioral recommendations:  -Begin taking BH medication as prescribed -Register and attend at least one Mom Talk online new mom support group (consider Breastfeeding Support as well) -Add Calm Harm app to phone, and use, if thoughts return -Keep crisis numbers (sent via MyChart) if thoughts of cutting return  3. Referral(s): Integrated [employer] (In Clinic) and [employer]:  New mom support  I discussed the assessment and treatment plan with the patient and/or parent/guardian. They were provided an opportunity to ask questions and all were answered. They agreed with the plan and demonstrated an understanding of the instructions.   They were advised to call back or seek an in-person evaluation if the symptoms worsen or if the condition fails to improve as anticipated.  Katrina May Katrina May  Depression screen [facility] 2/9 07/20/2019 06/24/2019 06/17/2019 12/24/2018  Decreased Interest 1 0 0 1  Down, Depressed, Hopeless 2 1 1  0  PHQ - 2 Score 3 1 1 1   Altered sleeping 1 2 2 2   Tired, decreased energy 1 2 1  1  Change in appetite 2 0 0 3  Feeling bad or failure  about yourself  1 0 0 0  Trouble concentrating 0 0 0 0  Moving slowly or fidgety/restless 1 0 0 1  Suicidal thoughts 1 0 0 0  PHQ-9 Score 10 5 4 8   Difficult doing work/chores - - Not difficult at all -   GAD 7 : Generalized Anxiety Score 07/20/2019 06/24/2019 06/17/2019 12/24/2018  Nervous, Anxious, on Edge 2 1 1 2   Control/stop worrying 1 1 1 2   Worry too much - different things 2 1 1 2   Trouble relaxing 3 1 1 1   Restless 1 0 0 -  Easily annoyed or irritable 1 1 1 2   Afraid - awful might happen 0 0 0 1  Total GAD 7 Score 10 5 5  -

## 2019-07-14 NOTE — Telephone Encounter (Signed)
LM for pt that I am calling in regards to the f/u appt that we discussed yesterday.  I stated that she has an appt scheduled virtually on 07/20/19 @ 1515.  She can look at her appt via [place] and if she has any questions to please give the office a call.   Mel Almond, RN 07/14/19

## 2019-07-20 ENCOUNTER — Other Ambulatory Visit: Payer: Self-pay

## 2019-07-20 ENCOUNTER — Ambulatory Visit (INDEPENDENT_AMBULATORY_CARE_PROVIDER_SITE_OTHER): Payer: Medicaid Other | Admitting: Clinical

## 2019-07-20 DIAGNOSIS — F4323 Adjustment disorder with mixed anxiety and depressed mood: Secondary | ICD-10-CM | POA: Diagnosis not present

## 2019-07-26 ENCOUNTER — Telehealth (INDEPENDENT_AMBULATORY_CARE_PROVIDER_SITE_OTHER): Payer: Medicaid Other | Admitting: Family Medicine

## 2019-07-26 ENCOUNTER — Encounter: Payer: Self-pay | Admitting: Family Medicine

## 2019-07-26 ENCOUNTER — Other Ambulatory Visit: Payer: Self-pay

## 2019-07-26 DIAGNOSIS — Z1389 Encounter for screening for other disorder: Secondary | ICD-10-CM

## 2019-07-26 DIAGNOSIS — F53 Postpartum depression: Secondary | ICD-10-CM

## 2019-07-26 DIAGNOSIS — O99345 Other mental disorders complicating the puerperium: Secondary | ICD-10-CM

## 2019-07-26 MED ORDER — SERTRALINE HCL 25 MG PO TABS: 25.0000 mg | ORAL_TABLET | Freq: Every day | ORAL | 0 refills | Status: DC

## 2019-07-26 NOTE — Progress Notes (Signed)
I connected with Katrina May on 07/26/19 at  3:35 PM EST by: phone and verified that I am speaking with the correct person using two identifiers.  Patient is located at home and provider is located at office.     The purpose of this virtual visit is to provide medical care while limiting exposure to the novel coronavirus. I discussed the limitations, risks, security and privacy concerns of performing an evaluation and management service by telephone and the availability of in person appointments. I also discussed with the patient that there may be a patient responsible charge related to this service. By engaging in this virtual visit, you consent to the provision of healthcare.  Additionally, you authorize for your insurance to be billed for the services provided during this visit.  The patient expressed understanding and agreed to proceed.  The following staff members participated in the virtual visit: RN and MD  Post Partum Visit Note Subjective:    [name] is a 23 y.o. G2P1011 female who presents for a postpartum visit. She is 4 weeks 1 day postpartum following a low cervical transverse Cesarean section. I have fully reviewed the prenatal and intrapartum course. The delivery was at 40 weeks 1 day. Outcome: primary cesarean section, low transverse incision. Anesthesia: epidural. Postpartum course has been mostly uncomplicated; some post-partum depression. Baby's course has been uncomplicated. Baby is feeding by both breast and bottle - Similac with Iron and Jerlyn Ly Start. Bleeding changing a maxi pad every 3 hours; pt reports bleeding is less than menstrual period. Bowel function is abnormal: pt reports BM once a day that is painful and difficult to pass.. Bladder function is normal. Patient is not sexually active. Contraception method is Depo-Provera injections. Postpartum depression screening: positive.  Review of Systems Pertinent items noted in HPI and remainder of comprehensive ROS  otherwise negative.   Objective:  There were no vitals filed for this visit. Self-Obtained General: No acute distress, AAOx3       Assessment:    Normal postpartum exam. Pap smear due at next in-person; none done yet due to age. Post-partum depression; lack of support system. Sertraline initiated.   Plan:   1. Contraception: Upcoming appt with RN for Depo 2. Discussed depression/anxiety: Shared-decision making: Will start Sertraline; discussed side effects. Meeting with Roselyn Reef regularly. Encouraged to find PCP for f/u of depression and further prescriptions.  3. Follow up in: as needed   15 minutes of non-face-to-face time spent with the patient   Chauncey Mann, MD 07/26/2019 4:29 PM

## 2019-07-27 ENCOUNTER — Institutional Professional Consult (permissible substitution): Payer: Medicaid Other

## 2019-07-27 NOTE — BH Specialist Note (Signed)
Integrated Behavioral Health via Telemedicine Video Visit  07/27/2019 Katrina May [id number]  Number of Integrated Behavioral Health visits: 2 Session Start time: 1:50  Session End time: 2:07 Total time: 17  Referring Provider: Vonzella Nipple, PA-C Type of Visit: Video Patient/Family location: Home [facility] Provider location: [facility] All persons participating in visit: Patient Katrina May and [facility] Kimberleigh Mehan    Confirmed patient's address: Yes  Confirmed patient's phone number: Yes  Any changes to demographics: No   Confirmed patient's insurance: Yes  Any changes to patient's insurance: No   Discussed confidentiality: At previous viist  I connected with [name] by a video enabled telemedicine application and verified that I am speaking with the correct person using two identifiers.     I discussed the limitations of evaluation and management by telemedicine and the availability of in person appointments.  I discussed that the purpose of this visit is to provide behavioral health care while limiting exposure to the novel coronavirus.   Discussed there is a possibility of technology failure and discussed alternative modes of communication if that failure occurs.  I discussed that engaging in this video visit, they consent to the provision of behavioral healthcare and the services will be billed under their insurance.  Patient and/or legal guardian expressed understanding and consented to video visit: Yes   PRESENTING CONCERNS: Patient and/or family reports the following symptoms/concerns: Pt states her primary concern today is feeling "like homesickness, and start crying", with lack of appetite some days; pt is taking Zoloft for past 2 days, felt "a little drowsy" after taking. No thoughts of harm.  Duration of problem: Increase during perinatal; Severity of problem: moderate  STRENGTHS (Protective Factors/Coping Skills): Open to treatment; good social support  GOALS  ADDRESSED: Patient will: 1.  Reduce symptoms of: anxiety and depression  2.  Demonstrate ability to: Increase healthy adjustment to current life circumstances  INTERVENTIONS: Interventions utilized:  Supportive Counseling and Medication Monitoring Standardized Assessments completed: Not Needed  ASSESSMENT: Patient currently experiencing Adjustment disorder with mixed anxiety and depressed mood.   Patient may benefit from continued psychoeducation and brief therapeutic interventions regarding coping with symptoms of depression and anxiety .  PLAN: 1. Follow up with behavioral health clinician on : One week 2. Behavioral recommendations:  -Continue taking Zoloft as prescribed -Attend Parents as Teachers class -Consider Calm Harm app and crisis numbers, as needed -Check MyChart for list of PCP's accepting new patients 3. Referral(s): Integrated [employer] (In Clinic)  I discussed the assessment and treatment plan with the patient and/or parent/guardian. They were provided an opportunity to ask questions and all were answered. They agreed with the plan and demonstrated an understanding of the instructions.   They were advised to call back or seek an in-person evaluation if the symptoms worsen or if the condition fails to improve as anticipated.  [name] Jill Ruppe  Depression screen [facility] 2/9 07/20/2019 06/24/2019 06/17/2019 12/24/2018  Decreased Interest 1 0 0 1  Down, Depressed, Hopeless 2 1 1  0  PHQ - 2 Score 3 1 1 1   Altered sleeping 1 2 2 2   Tired, decreased energy 1 2 1 1   Change in appetite 2 0 0 3  Feeling bad or failure about yourself  1 0 0 0  Trouble concentrating 0 0 0 0  Moving slowly or fidgety/restless 1 0 0 1  Suicidal thoughts 1 0 0 0  PHQ-9 Score 10 5 4 8   Difficult doing work/chores - - Not difficult at  all -   GAD 7 : Generalized Anxiety Score 07/20/2019 06/24/2019 06/17/2019 12/24/2018  Nervous, Anxious, on Edge 2 1 1 2   Control/stop worrying 1 1 1 2    Worry too much - different things 2 1 1 2   Trouble relaxing 3 1 1 1   Restless 1 0 0 -  Easily annoyed or irritable 1 1 1 2   Afraid - awful might happen 0 0 0 1  Total GAD 7 Score 10 5 5  -

## 2019-07-28 ENCOUNTER — Other Ambulatory Visit: Payer: Self-pay

## 2019-07-28 ENCOUNTER — Ambulatory Visit (INDEPENDENT_AMBULATORY_CARE_PROVIDER_SITE_OTHER): Payer: Medicaid Other | Admitting: Clinical

## 2019-07-28 DIAGNOSIS — F4323 Adjustment disorder with mixed anxiety and depressed mood: Secondary | ICD-10-CM | POA: Diagnosis not present

## 2019-07-29 NOTE — BH Specialist Note (Signed)
Pt did not arrive to video visit and did not answer the phone ; Left HIPPA-compliant message to call back Roselyn Reef from [facility] at [phone].  ; left MyChart message for patient.    Katrina May via Telemedicine Video Visit  07/29/2019 Katrina May [id number]  [name]

## 2019-08-02 ENCOUNTER — Ambulatory Visit: Payer: Medicaid Other

## 2019-08-03 ENCOUNTER — Encounter: Payer: Self-pay | Admitting: General Practice

## 2019-08-04 ENCOUNTER — Ambulatory Visit: Payer: Medicaid Other | Admitting: Clinical

## 2019-08-04 ENCOUNTER — Other Ambulatory Visit: Payer: Self-pay

## 2019-08-04 DIAGNOSIS — Z5329 Procedure and treatment not carried out because of patient's decision for other reasons: Secondary | ICD-10-CM

## 2019-08-04 DIAGNOSIS — Z91199 Patient's noncompliance with other medical treatment and regimen due to unspecified reason: Secondary | ICD-10-CM

## 2019-10-25 IMAGING — US OBSTETRIC <14 WK US AND TRANSVAGINAL OB US
1 series · 15 of 28 positions shown · non-contrast
Comparison: None.

CLINICAL DATA: Acute right abdominal pain. Estimated gestational
age by LMP is 5 weeks 4 days. Positive urine pregnancy test.
Quantitative beta HCG is pending.

EXAM:
OBSTETRIC <14 WK US AND TRANSVAGINAL OB US
TECHNIQUE: Both transabdominal and transvaginal ultrasound examinations were
performed for complete evaluation of the gestation as well as the
maternal uterus, adnexal regions, and pelvic cul-de-sac.
Transvaginal technique was performed to assess early pregnancy.

[Series 1: obstetric <14 wk us and transvaginal ob us · 15 of 55 slices shown]
[im 1/55]
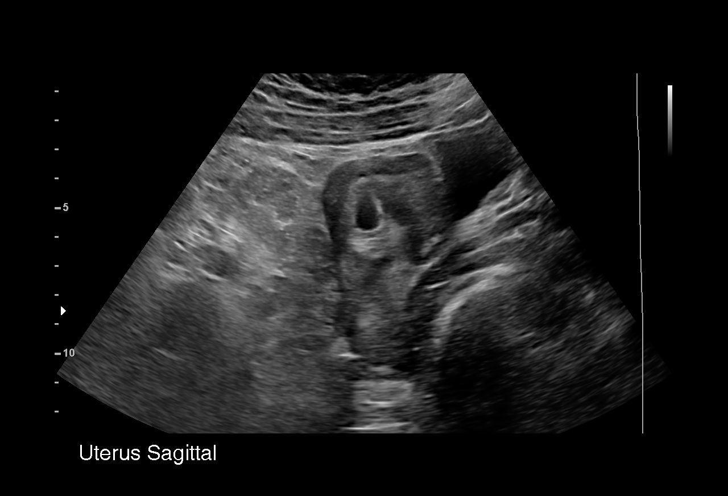
[im 5/55]
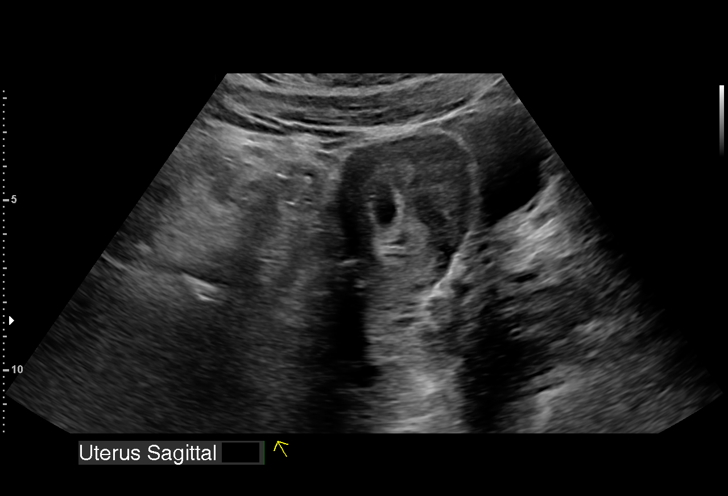
[im 9/55]
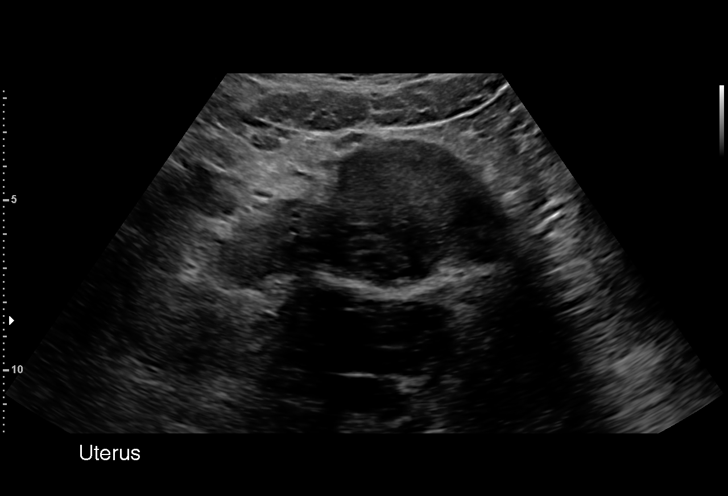
[im 13/55]
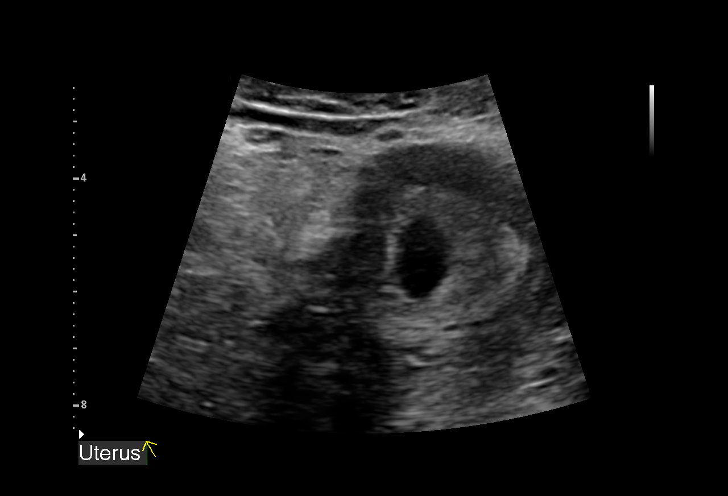
[im 17/55]
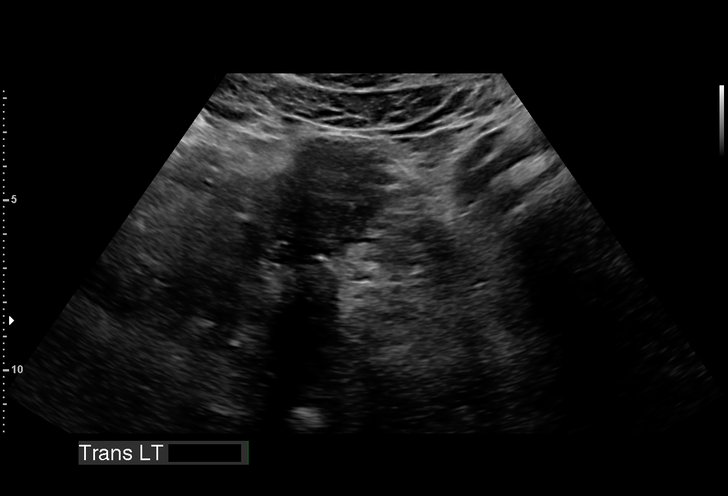
[im 21/55]
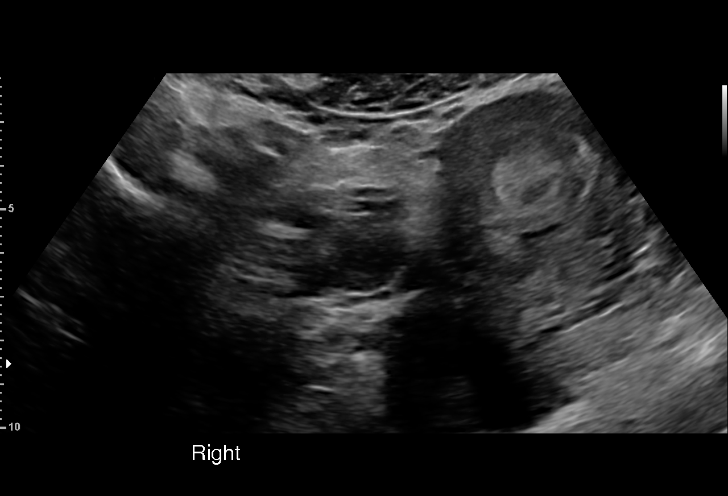
[im 25/55]
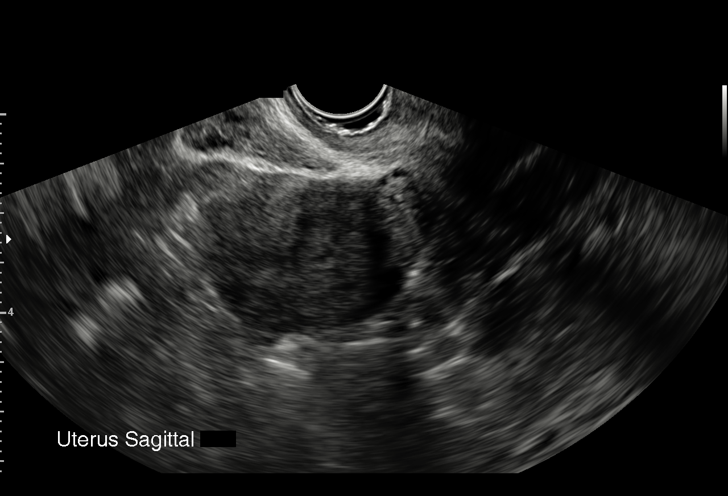
[im 29/55]
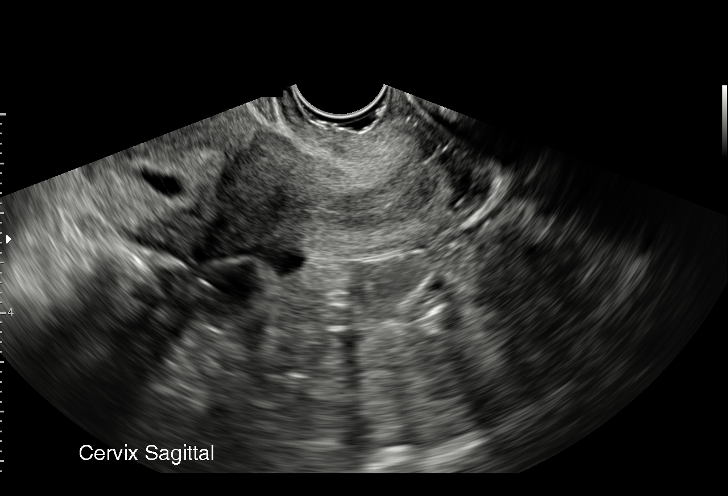
[im 31/55]
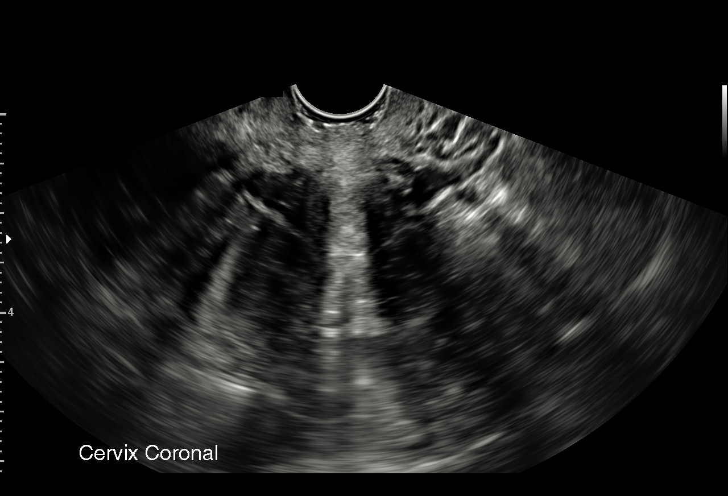
[im 35/55]
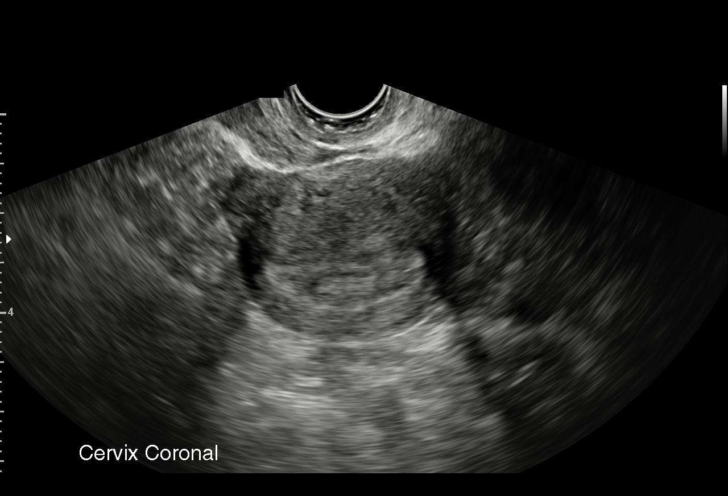
[im 39/55]
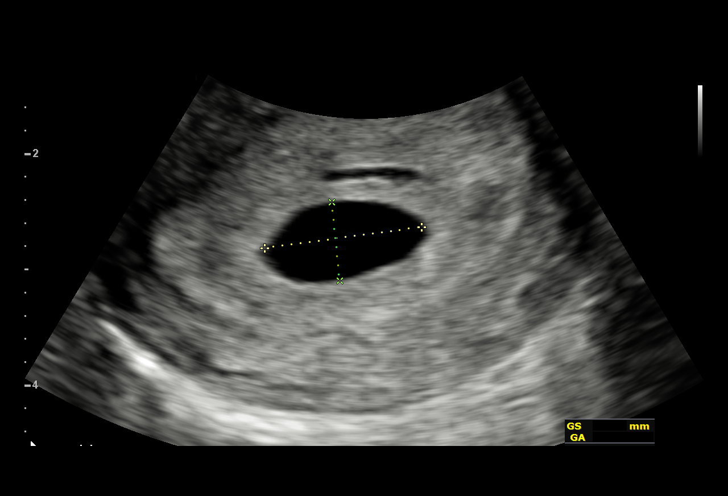
[im 43/55]
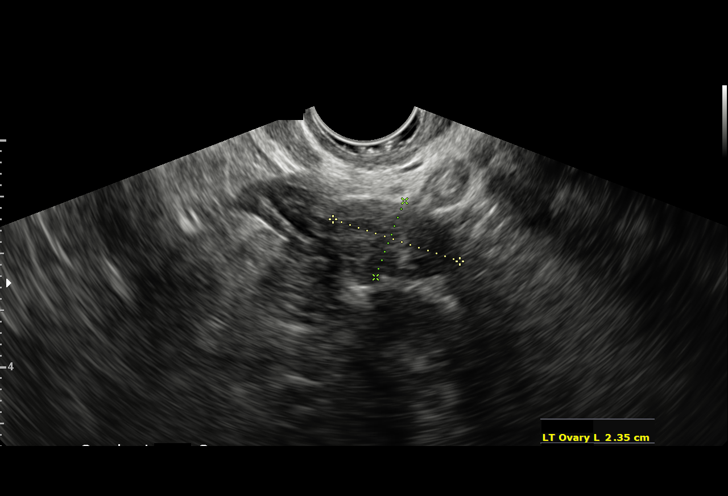
[im 47/55]
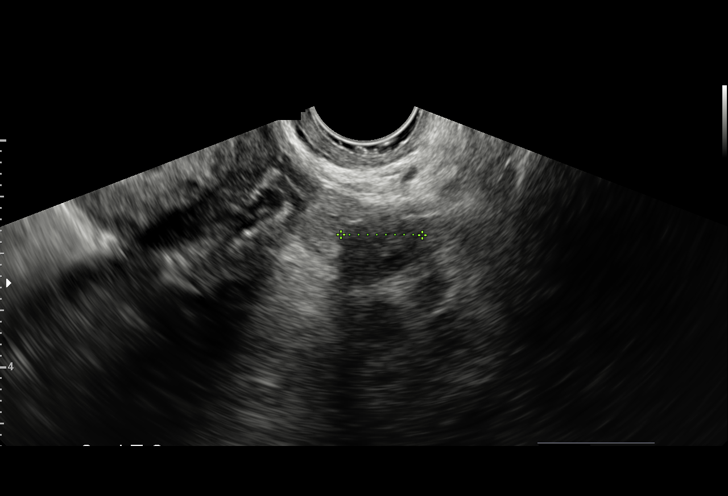
[im 51/55]
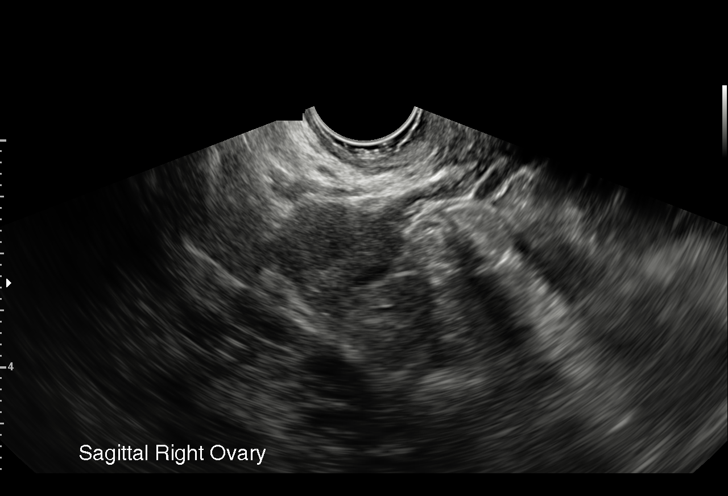
[im 55/55]
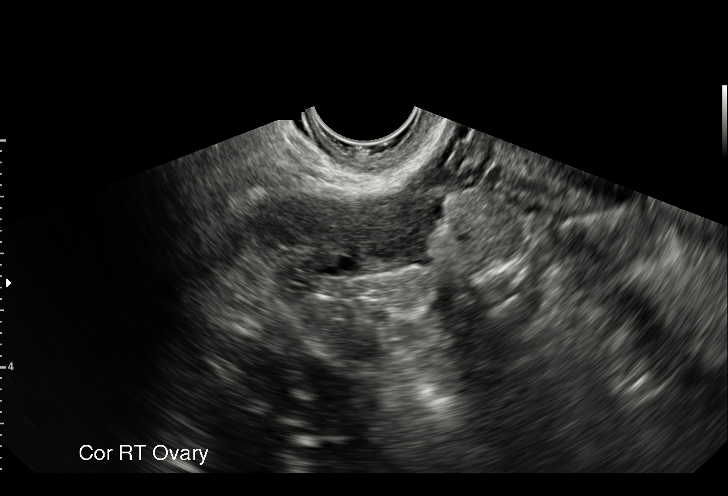

[15 of 28 positions shown; findings below may reference images not displayed]

FINDINGS: Intrauterine gestational sac: A single intrauterine gestational sac
is identified.

Yolk sac:  Yolk sac is visualized.

Embryo:  Fetal pole is not identified.

Cardiac Activity: Not identified.

MSD: 10 mm   5 w   5 d

Subchorionic hemorrhage: Minimal subchorionic hemorrhage is present.

Maternal uterus/adnexae: Uterus is anteverted. No myometrial mass
lesions identified. Both ovaries are visualized and appear normal.
No abnormal adnexal masses. No abnormal pelvic fluid collections.
IMPRESSION: Probable early intrauterine gestational sac with a yolk sac, but no
fetal pole or cardiac activity yet visualized. Recommend follow-up
quantitative B-HCG levels and follow-up US in 14 days to assess
viability. This recommendation follows SRU consensus guidelines:
Diagnostic Criteria for Nonviable Pregnancy Early in the First
Trimester. N Engl J Med 9852; [DATE].

## 2019-12-21 ENCOUNTER — Encounter: Payer: Self-pay | Admitting: *Deleted

## 2020-03-02 ENCOUNTER — Encounter: Payer: Self-pay | Admitting: General Practice

## 2020-03-02 ENCOUNTER — Ambulatory Visit (INDEPENDENT_AMBULATORY_CARE_PROVIDER_SITE_OTHER): Payer: Medicaid Other | Admitting: General Practice

## 2020-03-02 ENCOUNTER — Other Ambulatory Visit: Payer: Self-pay

## 2020-03-02 DIAGNOSIS — Z3201 Encounter for pregnancy test, result positive: Secondary | ICD-10-CM | POA: Diagnosis not present

## 2020-03-02 LAB — POCT PREGNANCY, URINE: Preg Test, Ur: POSITIVE — AB

## 2020-03-02 NOTE — Progress Notes (Signed)
Patient dropped off urine for UPT. UPT +.  Called patient & she reports first positive home test 3 days ago. LMP 01/17/20 EDD 10/23/20 [redacted]w[redacted]d. Patient is taking PNV- denies nausea/vomiting. Will return to begin OB care.  Chase Caller RN BSN 03/02/20

## 2020-03-07 NOTE — Progress Notes (Signed)
Patient was assessed and managed by nursing staff during this encounter. I have reviewed the chart and agree with the documentation and plan. I have also made any necessary editorial changes.  Erle Guster, MD 03/07/2020 8:18 AM   

## 2020-03-21 ENCOUNTER — Other Ambulatory Visit: Payer: Self-pay

## 2020-03-21 MED ORDER — PROMETHAZINE HCL 25 MG PO TABS: 25.0000 mg | ORAL_TABLET | Freq: Four times a day (QID) | ORAL | 0 refills | Status: DC | PRN

## 2020-03-30 ENCOUNTER — Encounter: Payer: Medicaid Other | Admitting: Family Medicine

## 2020-04-21 ENCOUNTER — Encounter: Payer: Medicaid Other | Admitting: Obstetrics and Gynecology

## 2020-04-21 ENCOUNTER — Encounter: Payer: Self-pay | Admitting: Obstetrics and Gynecology

## 2020-04-21 DIAGNOSIS — Z98891 History of uterine scar from previous surgery: Secondary | ICD-10-CM | POA: Insufficient documentation

## 2022-04-15 ENCOUNTER — Encounter (HOSPITAL_COMMUNITY): Payer: Self-pay

## 2022-04-15 ENCOUNTER — Ambulatory Visit (HOSPITAL_COMMUNITY)
Admission: EM | Admit: 2022-04-15 | Discharge: 2022-04-15 | Disposition: A | Discharge status: Home / Self Care | Payer: Medicaid Other | Attending: Internal Medicine | Admitting: Internal Medicine

## 2022-04-15 DIAGNOSIS — K2990 Gastroduodenitis, unspecified, without bleeding: Secondary | ICD-10-CM | POA: Diagnosis present

## 2022-04-15 LAB — CBC WITH DIFFERENTIAL/PLATELET
Abs Immature Granulocytes: 0.03 10*3/uL (ref 0.00–0.07)
Basophils Absolute: 0 10*3/uL (ref 0.0–0.1)
Basophils Relative: 0 %
Eosinophils Absolute: 0.1 10*3/uL (ref 0.0–0.5)
Eosinophils Relative: 1 %
HCT: 40.8 % (ref 36.0–46.0)
Hemoglobin: 13.6 g/dL (ref 12.0–15.0)
Immature Granulocytes: 0 %
Lymphocytes Relative: 27 %
Lymphs Abs: 2.7 10*3/uL (ref 0.7–4.0)
MCH: 30.8 pg (ref 26.0–34.0)
MCHC: 33.3 g/dL (ref 30.0–36.0)
MCV: 92.5 fL (ref 80.0–100.0)
Monocytes Absolute: 0.6 10*3/uL (ref 0.1–1.0)
Monocytes Relative: 6 %
Neutro Abs: 6.5 10*3/uL (ref 1.7–7.7)
Neutrophils Relative %: 66 %
Platelets: 219 10*3/uL (ref 150–400)
RBC: 4.41 MIL/uL (ref 3.87–5.11)
RDW: 12.5 % (ref 11.5–15.5)
WBC: 10 10*3/uL (ref 4.0–10.5)
nRBC: 0 % (ref 0.0–0.2)

## 2022-04-15 LAB — COMPREHENSIVE METABOLIC PANEL
ALT: 16 U/L (ref 0–44)
AST: 21 U/L (ref 15–41)
Albumin: 4 g/dL (ref 3.5–5.0)
Alkaline Phosphatase: 61 U/L (ref 38–126)
Anion gap: 6 (ref 5–15)
BUN: 10 mg/dL (ref 6–20)
CO2: 26 mmol/L (ref 22–32)
Calcium: 9 mg/dL (ref 8.9–10.3)
Chloride: 104 mmol/L (ref 98–111)
Creatinine, Ser: 0.8 mg/dL (ref 0.44–1.00)
GFR, Estimated: >60 mL/min (ref >60)
Glucose, Bld: 106 mg/dL — ABNORMAL HIGH (ref 70–99)
Potassium: 4.5 mmol/L (ref 3.5–5.1)
Sodium: 136 mmol/L (ref 135–145)
Total Bilirubin: 0.4 mg/dL (ref 0.3–1.2)
Total Protein: 7 g/dL (ref 6.5–8.1)

## 2022-04-15 MED ORDER — PANTOPRAZOLE SODIUM 20 MG PO TBEC: 20.0000 mg | DELAYED_RELEASE_TABLET | Freq: Every day | ORAL | 0 refills | Status: DC

## 2022-04-15 MED ORDER — LIDOCAINE VISCOUS HCL 2 % MT SOLN
15.0000 mL | Freq: Once | OROMUCOSAL | Status: AC
  Administered 2022-04-15: 15 mL via ORAL

## 2022-04-15 MED ORDER — ALUM & MAG HYDROXIDE-SIMETH 200-200-20 MG/5ML PO SUSP
ORAL | Status: AC
  Filled 2022-04-15: qty 30

## 2022-04-15 MED ORDER — LIDOCAINE VISCOUS HCL 2 % MT SOLN
OROMUCOSAL | Status: AC
  Filled 2022-04-15: qty 15

## 2022-04-15 MED ORDER — SUCRALFATE 1 G PO TABS: 1.0000 g | ORAL_TABLET | Freq: Three times a day (TID) | ORAL | 0 refills | Status: DC

## 2022-04-15 MED ORDER — ALUM & MAG HYDROXIDE-SIMETH 200-200-20 MG/5ML PO SUSP
30.0000 mL | Freq: Once | ORAL | Status: AC
  Administered 2022-04-15: 30 mL via ORAL

## 2022-04-15 NOTE — ED Provider Notes (Signed)
[facility]    CSN: [id number] Arrival date & time: 04/15/22  1704      History   Chief Complaint Chief Complaint  Patient presents with   Dizziness    HPI Katrina May is a 26 y.o. female comes to the urgent care with 1 day history of epigastric and right upper quadrant abdominal pain of 1 day duration.  Pain started right after patient ate.  She describes the pain as 7 out of 10, sharp with no known relieving factors.  Pain was associated with lightheadedness, dizziness and feeling clammy.  Pain radiated to right upper back.  No trauma to the abdomen.  Patient endorses epigastric pain with spicy food intake.  She had some nausea but no vomiting.  No change in bowel habits.  No fever or chills.  No sick contacts. HPI  Past Medical History:  Diagnosis Date   Medical history non-contributory     Patient Active Problem List   Diagnosis Date Noted   H/O: C-section 04/21/2020   S/P emergency cesarean section 06/27/2019   Supervision of low-risk pregnancy 12/08/2018    Past Surgical History:  Procedure Laterality Date   CESAREAN SECTION N/A 06/27/2019   Procedure: CESAREAN SECTION;  Surgeon: Tereso Newcomer, MD;  [facility];  Service: Obstetrics;  Laterality: N/A;   NO PAST SURGERIES      OB History     Gravida  2   Para  1   Term  1   Preterm  0   AB  1   Living  1      SAB  1   IAB  0   Ectopic  0   Multiple  0   Live Births  1            Home Medications    Prior to Admission medications   Medication Sig Start Date End Date Taking? Authorizing Provider  pantoprazole (PROTONIX) 20 MG tablet Take 1 tablet (20 mg total) by mouth daily. 04/15/22  Yes Whitney Bingaman, Britta Mccreedy, MD  sucralfate (CARAFATE) 1 g tablet Take 1 tablet (1 g total) by mouth 4 (four) times daily -  with meals and at bedtime for 10 days. 04/15/22 04/25/22 Yes Nakya Weyand, Britta Mccreedy, MD  gabapentin (NEURONTIN) 100 MG capsule Take 1 capsule (100 mg total) by mouth 3  (three) times daily. Patient not taking: Reported on 07/26/2019 06/29/19   Donette Larry, CNM  hydrochlorothiazide (HYDRODIURIL) 25 MG tablet Take 1 tablet (25 mg total) by mouth daily. Patient not taking: Reported on 07/26/2019 07/05/19   Levie Heritage, DO  ibuprofen (ADVIL) 800 MG tablet Take 1 tablet (800 mg total) by mouth every 6 (six) hours. 06/29/19   Donette Larry, CNM  Prenatal Vit-Fe Fumarate-FA (PRENATAL MULTIVITAMIN) TABS tablet Take 1 tablet by mouth daily with lunch.     [provider]  promethazine (PHENERGAN) 25 MG tablet Take 1 tablet (25 mg total) by mouth every 6 (six) hours as needed for nausea or vomiting. 03/21/20   Marylene Land, CNM  sertraline (ZOLOFT) 25 MG tablet Take 1 tablet (25 mg total) by mouth daily. 07/26/19   FairHoyle Sauer, MD    Family History Family History  Problem Relation Age of Onset   Diabetes Paternal Grandfather    Healthy Mother    Healthy Father     Social History Social History   Tobacco Use   Smoking status: Never   Smokeless tobacco: Never  Vaping Use  Vaping Use: Never used  Substance Use Topics   Alcohol use: Never   Drug use: Never     Allergies   Patient has no known allergies.   Review of Systems Review of Systems  HENT: Negative.  Negative for sore throat.   Respiratory: Negative.    Cardiovascular: Negative.   Gastrointestinal:  Positive for abdominal pain and nausea. Negative for abdominal distention, constipation, diarrhea and vomiting.  Genitourinary: Negative.   Musculoskeletal: Negative.   Neurological:  Positive for dizziness. Negative for syncope and weakness.     Physical Exam Triage Vital Signs ED Triage Vitals  Enc Vitals Group     BP 04/15/22 1801 117/81     Pulse Rate 04/15/22 1801 67     Resp 04/15/22 1801 12     Temp 04/15/22 1801 (!) 97 F (36.1 C)     Temp Source 04/15/22 1801 Oral     SpO2 04/15/22 1801 98 %     Weight 04/15/22 1800 130 lb (59 kg)      Height 04/15/22 1800 5' (1.524 m)     Head Circumference --      Peak Flow --      Pain Score 04/15/22 1758 6     Pain Loc --      Pain Edu? --      Excl. in GC? --    No data found.  Updated Vital Signs BP 117/81 (BP Location: Right Arm)   Pulse 67   Temp (!) 97 F (36.1 C) (Oral)   Resp 12   Ht 5' (1.524 m)   Wt 59 kg   SpO2 98%   BMI 25.39 kg/m   Visual Acuity Right Eye Distance:   Left Eye Distance:   Bilateral Distance:    Right Eye Near:   Left Eye Near:    Bilateral Near:     Physical Exam Vitals and nursing note reviewed.  Constitutional:      General: She is in acute distress.     Appearance: Normal appearance. She is not ill-appearing.  HENT:     Right Ear: Tympanic membrane normal.     Left Ear: Tympanic membrane normal.  Cardiovascular:     Rate and Rhythm: Normal rate and regular rhythm.     Pulses: Normal pulses.     Heart sounds: Normal heart sounds.  Pulmonary:     Effort: Pulmonary effort is normal.     Breath sounds: Normal breath sounds.  Abdominal:     General: Bowel sounds are normal.     Palpations: Abdomen is soft. There is no mass.     Tenderness: There is abdominal tenderness. There is no guarding or rebound.     Hernia: No hernia is present.  Musculoskeletal:        General: Normal range of motion.  Skin:    General: Skin is warm.  Neurological:     Mental Status: She is alert.      UC Treatments / Results  Labs (all labs ordered are listed, but only abnormal results are displayed) Labs Reviewed  CBC WITH DIFFERENTIAL/PLATELET  COMPREHENSIVE METABOLIC PANEL    EKG   Radiology No results found.  Procedures Procedures (including critical care time)  Medications Ordered in UC Medications  alum & mag hydroxide-simeth (MAALOX/MYLANTA) 200-200-20 MG/5ML suspension 30 mL (30 mLs Oral Given 04/15/22 1906)    And  lidocaine (XYLOCAINE) 2 % viscous mouth solution 15 mL (15 mLs Oral Given 04/15/22 1906)  Initial  Impression / Assessment and Plan / UC Course  I have reviewed the triage vital signs and the nursing notes.  Pertinent labs & imaging results that were available during my care of the patient were reviewed by me and considered in my medical decision making (see chart for details).     1.  Acute gastritis: Abdominal pain and other symptoms subsided 10 to 15 minutes after GI cocktail Patient was prescribed Protonix and Carafate Patient is advised to avoid spicy food over the next few days Return precautions given CBC, CMP We will call patient with recommendations if labs are abnormal  Final Clinical Impressions(s) / UC Diagnoses   Final diagnoses:  Gastritis and duodenitis     Discharge Instructions      Please take medications as prescribed Please avoid spicy food for the next few days Maintain adequate hydration Return to urgent care if you have worsening pain or any other concerns We will call you with recommendations if labs are abnormal.   ED Prescriptions     Medication Sig Dispense Auth. Provider   pantoprazole (PROTONIX) 20 MG tablet Take 1 tablet (20 mg total) by mouth daily. 30 tablet Erik Burkett, Britta Mccreedy, MD   sucralfate (CARAFATE) 1 g tablet Take 1 tablet (1 g total) by mouth 4 (four) times daily -  with meals and at bedtime for 10 days. 40 tablet Mannix Kroeker, Britta Mccreedy, MD      PDMP not reviewed this encounter.   Merrilee Jansky, MD 04/15/22 2021

## 2022-04-15 NOTE — Discharge Instructions (Addendum)
Please take medications as prescribed Please avoid spicy food for the next few days Maintain adequate hydration Return to urgent care if you have worsening pain or any other concerns We will call you with recommendations if labs are abnormal.

## 2022-04-15 NOTE — ED Triage Notes (Signed)
Pt has been light headed, dizziness, clammy shaky, sharp pain in chest radiating to back of shoulders with some numbness and tingling, pt also states when it occurred she could not catch her breath . Pt states the back of head feels tender x1day

## 2024-04-27 ENCOUNTER — Ambulatory Visit

## 2024-05-24 ENCOUNTER — Encounter: Payer: Self-pay | Admitting: *Deleted

## 2024-05-27 ENCOUNTER — Telehealth (INDEPENDENT_AMBULATORY_CARE_PROVIDER_SITE_OTHER)

## 2024-05-27 DIAGNOSIS — Z3A10 10 weeks gestation of pregnancy: Secondary | ICD-10-CM | POA: Diagnosis not present

## 2024-05-27 DIAGNOSIS — Z8659 Personal history of other mental and behavioral disorders: Secondary | ICD-10-CM | POA: Diagnosis not present

## 2024-05-27 DIAGNOSIS — O99891 Other specified diseases and conditions complicating pregnancy: Secondary | ICD-10-CM

## 2024-05-27 DIAGNOSIS — Z3490 Encounter for supervision of normal pregnancy, unspecified, unspecified trimester: Secondary | ICD-10-CM | POA: Insufficient documentation

## 2024-05-27 MED ORDER — BLOOD PRESSURE KIT DEVI: 1.0000 | Freq: Once | 0 refills | Status: AC

## 2024-05-27 MED ORDER — GOJJI WEIGHT SCALE MISC: 1.0000 | Freq: Once | 0 refills | Status: AC

## 2024-05-27 MED ORDER — PROMETHAZINE HCL 25 MG PO TABS: 25.0000 mg | ORAL_TABLET | Freq: Four times a day (QID) | ORAL | 0 refills | Status: AC | PRN

## 2024-05-27 NOTE — Progress Notes (Signed)
 New OB Intake  I connected with Katrina May  on 05/27/24 at  8:15 AM EDT by MyChart Video Visit and verified that I am speaking with the correct person using two identifiers. Nurse is located at [facility] and pt is located at home.  I discussed the limitations, risks, security and privacy concerns of performing an evaluation and management service by telephone and the availability of in person appointments. I also discussed with the patient that there may be a patient responsible charge related to this service. The patient expressed understanding and agreed to proceed.  I explained I am completing New OB Intake today. We discussed EDD of 12/21/24 based on LMP of 03/16/24. Pt is G4P1021. I reviewed her allergies, medications and Medical/Surgical/OB history.    Patient Active Problem List   Diagnosis Date Noted   Encounter for supervision of low-risk pregnancy, antepartum 05/27/2024   Hx of postpartum depression, currently pregnant 05/27/2024   H/O: C-section 04/21/2020     Concerns addressed today Pt reports nausea and drowsiness during pregnancy.  Safe medication list provided, prescription sent to pt pharmacy.  Recommended drinking plenty of fluids and regular meals.  Delivery Plans Plans to deliver at [facility]. Discussed the nature of our practice with multiple providers including residents and students as well as female and female providers. Due to the size of the practice, the delivering provider may not be the same as those providing prenatal care.   Patient is interested in water  birth.  MyChart/Babyscripts MyChart access verified. I explained pt will have some visits in office and some virtually. Babyscripts instructions given and order placed. Patient verifies receipt of registration text/e-mail. Account successfully created and app downloaded. If patient is a candidate for Optimized scheduling, add to sticky note.   Blood Pressure Cuff/Weight Scale Blood pressure cuff ordered for  patient to pick-up from [employer]. Explained after first prenatal appt pt will check weekly and document in Babyscripts. Patient does not have weight scale; order sent to [facility], patient may track weight weekly in Babyscripts.  Anatomy US  Explained first scheduled US  will be around 19 weeks. Anatomy US  scheduled for 07/29/24 at 08:00am.  Is patient a CenteringPregnancy candidate?  Declined Declined due to Group setting    Is patient a Mom+Baby Combined Care candidate?  Not a candidate     Is patient a candidate for Babyscripts Optimization? Yes, patient declined   First visit review I reviewed new OB appt with patient. Explained pt will be seen by [name], NP at first visit. Discussed [place] genetic screening with patient. Desires [employer] and [employer]. Routine prenatal labs and genetic testing needed at [facility].   Last Pap No results found for: DIAGPAP  Pap Smear result 07/02/23 NILM at [facility].  Waddell LITTIE Burows, RN 05/27/2024  8:57 AM

## 2024-05-27 NOTE — Patient Instructions (Signed)

## 2024-05-31 ENCOUNTER — Ambulatory Visit: Admitting: Obstetrics and Gynecology

## 2024-05-31 ENCOUNTER — Encounter: Payer: Self-pay | Admitting: Obstetrics and Gynecology

## 2024-05-31 ENCOUNTER — Other Ambulatory Visit (HOSPITAL_COMMUNITY)
Admission: RE | Admit: 2024-05-31 | Discharge: 2024-05-31 | Disposition: A | Discharge status: Home / Self Care | Source: Ambulatory Visit | Attending: Obstetrics and Gynecology | Admitting: Obstetrics and Gynecology

## 2024-05-31 ENCOUNTER — Other Ambulatory Visit: Payer: Self-pay

## 2024-05-31 VITALS — BP 120/78 | HR 81 | Wt 148.6 lb

## 2024-05-31 DIAGNOSIS — Z3490 Encounter for supervision of normal pregnancy, unspecified, unspecified trimester: Secondary | ICD-10-CM | POA: Diagnosis present

## 2024-05-31 DIAGNOSIS — Z3480 Encounter for supervision of other normal pregnancy, unspecified trimester: Secondary | ICD-10-CM | POA: Insufficient documentation

## 2024-05-31 DIAGNOSIS — Z3A10 10 weeks gestation of pregnancy: Secondary | ICD-10-CM | POA: Diagnosis not present

## 2024-05-31 DIAGNOSIS — Z8659 Personal history of other mental and behavioral disorders: Secondary | ICD-10-CM

## 2024-05-31 DIAGNOSIS — O0990 Supervision of high risk pregnancy, unspecified, unspecified trimester: Secondary | ICD-10-CM | POA: Insufficient documentation

## 2024-05-31 DIAGNOSIS — Z8759 Personal history of other complications of pregnancy, childbirth and the puerperium: Secondary | ICD-10-CM

## 2024-05-31 DIAGNOSIS — O219 Vomiting of pregnancy, unspecified: Secondary | ICD-10-CM

## 2024-05-31 DIAGNOSIS — Z98891 History of uterine scar from previous surgery: Secondary | ICD-10-CM | POA: Diagnosis not present

## 2024-05-31 MED ORDER — DOXYLAMINE-PYRIDOXINE 10-10 MG PO TBEC: 2.0000 | DELAYED_RELEASE_TABLET | Freq: Every day | ORAL | 1 refills | Status: DC

## 2024-05-31 NOTE — Progress Notes (Signed)
 INITIAL PRENATAL VISIT  Subjective:   Katrina May is being seen today for her first obstetrical visit.   She is at [redacted]w[redacted]d gestation by LMP Her obstetrical history is significant for cesarean section. Relationship with FOB: significant other, living together. Patient does intend to breast feed. Pregnancy history fully reviewed.  Patient reports nausea .  Indications for ASA therapy (per uptodate) Two or more of the following: Nulliparity No Obesity (body mass index >30 kg/m2) No Family history of preeclampsia in mother or sister No Age >=35 years No Sociodemographic characteristics (African American race, low socioeconomic level) No Personal risk factors (eg, previous pregnancy with low birth weight or small for gestational age infant, previous adverse pregnancy outcome [eg, stillbirth], interval >10 years between pregnancies) No  Objective:    Obstetric History OB History  Gravida Para Term Preterm AB Living  4 1 1  0 2 1  SAB IAB Ectopic Multiple Live Births  2 0 0 0 1    # Outcome Date GA Lbr Len/2nd Weight Sex Type Anes PTL Lv  4 Current           3 SAB 2021 [redacted]w[redacted]d         2 Term 06/27/19 [redacted]w[redacted]d  7 lb 4.8 oz (3.31 kg) M CS-LTranv EPI  LIV  1 SAB 2017 [redacted]w[redacted]d           Past Medical History:  Diagnosis Date   Anxiety    Depression    S/P emergency cesarean section 06/27/2019   Supervision of low-risk pregnancy 12/08/2018   BABYSCRIPTS PATIENT: [ ] Initial [ ] 12 [ ] 20 [ ] 28 [x] 32 [x] 36 [ ] 38 [ ] 39 [ ] 40          Nursing Staff    Provider      Office Location     [facility]    Dating     LMP      Language     English    Anatomy US      normal      Flu Vaccine     06/02/2019    Genetic Screen     NIPS:  Low risk female         TDaP vaccine      04/06/2019    Hgb A1C or   GTT    Early   Third trimester 69/137/107      Rhogam      Past Surgical History:  Procedure Laterality Date   CESAREAN SECTION N/A 06/27/2019   Procedure: CESAREAN SECTION;  Surgeon: Herchel Gloris LABOR, MD;   [facility];  Service: Obstetrics;  Laterality: N/A;    Current Outpatient Medications on File Prior to Visit  Medication Sig Dispense Refill   Prenatal Vit-Fe Fumarate-FA (PRENATAL MULTIVITAMIN) TABS tablet Take 1 tablet by mouth daily with lunch.      promethazine  (PHENERGAN ) 25 MG tablet Take 1 tablet (25 mg total) by mouth every 6 (six) hours as needed for nausea or vomiting. 30 tablet 0   sertraline  (ZOLOFT ) 25 MG tablet Take 1 tablet (25 mg total) by mouth daily. (Patient not taking: Reported on 05/31/2024) 60 tablet 0   No current facility-administered medications on file prior to visit.    No Known Allergies  Social History:  reports that she has never smoked. She has never used smokeless tobacco. She reports that she does not currently use drugs after having used the following drugs: Marijuana. She reports that she does not drink alcohol.  Family History  Problem  Relation Age of Onset   Healthy Mother    Diabetes Father    ADD / ADHD Brother    Healthy Son    Diabetes Paternal Grandfather     The following portions of the patient's history were reviewed and updated as appropriate: allergies, current medications, past family history, past medical history, past social history, past surgical history and problem list.  Review of Systems Review of Systems  Gastrointestinal:  Positive for nausea.  All other systems reviewed and are negative.   Physical Exam:  BP 120/78   Pulse 81   Wt 148 lb 9.6 oz (67.4 kg)   LMP 03/16/2024   BMI 29.02 kg/m  CONSTITUTIONAL: Well-developed, well-nourished female in no acute distress.  HENT:  Normocephalic, atraumatic.   NECK: Normal range of motion SKIN: Skin is warm and dry. MUSCULOSKELETAL: Normal range of motion NEUROLOGIC: Alert and oriented  PSYCHIATRIC: Normal mood and affect. Normal behavior.  CARDIOVASCULAR: Normal heart rate noted RESPIRATORY: normal effort ABDOMEN: Soft PELVIC:deferred  Fetal Heart Rate (bpm):  172           Assessment:    Pregnancy: G4P1021  1. Encounter for supervision of low-risk pregnancy, antepartum (Primary) BP and FHR normal Doing well, feeling regular movement    - Culture, OB Urine - GC/Chlamydia probe amp ([place])not at [facility] - CBC/D/Plt+RPR+Rh+ABO+RubIgG... - Hemoglobin A1c - PANORAMA PRENATAL TEST - [facility] Basic Panel  2. History of cesarean section Emergency c/s for [facility]  Desires TOLAC   3. [redacted] weeks gestation of pregnancy   4. History of postpartum depression Had pp after delivery d/t weight gain   5. Supervision of other normal pregnancy, antepartum   6. Nausea and vomiting during pregnancy Phenergan  makes her drowsy, zofran  did not help  - Doxylamine-Pyridoxine 10-10 MG TBEC; Take 2 tablets by mouth at bedtime. May also take 1 tab in am and 1 tab in afternoon  Dispense: 100 tablet; Refill: 1       Plan:     Initial labs drawn. Prenatal vitamins. Problem list reviewed and updated. Reviewed in detail the nature of the practice with collaborative care between  Genetic screening discussed: NIPS/First trimester screen/Quad/AFP ordered. Role of ultrasound in pregnancy discussed; Anatomy US : ordered. Follow up in 4 weeks. Discussed clinic routines, schedule of care and testing, genetic screening options, involvement of students and residents under the direct supervision of APPs and doctors and presence of female providers. Pt verbalized understanding.   Future Appointments  Date Time Provider Department Center  06/28/2024  4:15 PM Nicholaus Burnard HERO, MD [facility]  07/29/2024  8:00 AM [facility] PROVIDER 1 [facility]  07/29/2024  8:30 AM [facility] US1 [facility]    [name] Nidia CROME, FNP

## 2024-06-02 ENCOUNTER — Ambulatory Visit: Payer: Self-pay | Admitting: Obstetrics and Gynecology

## 2024-06-02 DIAGNOSIS — Z3480 Encounter for supervision of other normal pregnancy, unspecified trimester: Secondary | ICD-10-CM

## 2024-06-02 LAB — CBC/D/PLT+RPR+RH+ABO+RUBIGG...
Basophils Absolute: 0 x10E3/uL (ref 0.0–0.2)
Basos: 0 %
EOS (ABSOLUTE): 0.1 x10E3/uL (ref 0.0–0.4)
Eos: 1 %
HCV Ab: NONREACTIVE
HIV Screen 4th Generation wRfx: NONREACTIVE
Hematocrit: 40.1 % (ref 34.0–46.6)
Hemoglobin: 13.5 g/dL (ref 11.1–15.9)
Hepatitis B Surface Ag: NEGATIVE
Immature Grans (Abs): 0 x10E3/uL (ref 0.0–0.1)
Immature Granulocytes: 0 %
Lymphocytes Absolute: 2.1 x10E3/uL (ref 0.7–3.1)
Lymphs: 23 %
MCH: 31.5 pg (ref 26.6–33.0)
MCHC: 33.7 g/dL (ref 31.5–35.7)
MCV: 94 fL (ref 79–97)
Monocytes Absolute: 0.7 x10E3/uL (ref 0.1–0.9)
Monocytes: 8 %
Neutrophils Absolute: 6.1 x10E3/uL (ref 1.4–7.0)
Neutrophils: 68 %
Platelets: 225 x10E3/uL (ref 150–450)
RBC: 4.28 x10E6/uL (ref 3.77–5.28)
RDW: 13.5 % (ref 11.7–15.4)
RPR Ser Ql: REACTIVE — AB
Rh Factor: POSITIVE
Rubella Antibodies, IGG: 2.05 {index} (ref >0.99)
WBC: 9.1 x10E3/uL (ref 3.4–10.8)

## 2024-06-02 LAB — URINE CULTURE, OB REFLEX

## 2024-06-02 LAB — HEMOGLOBIN A1C
Est. average glucose Bld gHb Est-mCnc: 105 mg/dL
Hgb A1c MFr Bld: 5.3 % (ref 4.8–5.6)

## 2024-06-02 LAB — GC/CHLAMYDIA PROBE AMP (~~LOC~~) NOT AT ARMC
Chlamydia: NEGATIVE
Comment: NEGATIVE
Comment: NORMAL
Neisseria Gonorrhea: NEGATIVE

## 2024-06-02 LAB — AB SCR+ANTIBODY ID: Antibody Screen: POSITIVE — AB

## 2024-06-02 LAB — CULTURE, OB URINE

## 2024-06-02 LAB — RPR, QUANT+TP ABS (REFLEX)
Rapid Plasma Reagin, Quant: 1:2 {titer} — ABNORMAL HIGH
T Pallidum Abs: NONREACTIVE

## 2024-06-02 LAB — HCV INTERPRETATION

## 2024-06-06 LAB — PANORAMA PRENATAL TEST FULL PANEL:PANORAMA TEST PLUS 5 ADDITIONAL MICRODELETIONS: FETAL FRACTION: 8.9

## 2024-06-10 ENCOUNTER — Encounter: Payer: Self-pay | Admitting: *Deleted

## 2024-06-16 LAB — HORIZON CUSTOM: REPORT SUMMARY: NEGATIVE

## 2024-06-28 ENCOUNTER — Other Ambulatory Visit: Payer: Self-pay

## 2024-06-28 ENCOUNTER — Ambulatory Visit: Admitting: Obstetrics and Gynecology

## 2024-06-28 ENCOUNTER — Other Ambulatory Visit (HOSPITAL_COMMUNITY)
Admission: RE | Admit: 2024-06-28 | Discharge: 2024-06-28 | Disposition: A | Discharge status: Home / Self Care | Source: Ambulatory Visit | Attending: Obstetrics and Gynecology | Admitting: Obstetrics and Gynecology

## 2024-06-28 ENCOUNTER — Encounter: Payer: Self-pay | Admitting: Obstetrics and Gynecology

## 2024-06-28 VITALS — BP 123/67 | HR 118 | Wt 155.0 lb

## 2024-06-28 DIAGNOSIS — Z3A14 14 weeks gestation of pregnancy: Secondary | ICD-10-CM

## 2024-06-28 DIAGNOSIS — N898 Other specified noninflammatory disorders of vagina: Secondary | ICD-10-CM | POA: Insufficient documentation

## 2024-06-28 DIAGNOSIS — Z23 Encounter for immunization: Secondary | ICD-10-CM

## 2024-06-28 DIAGNOSIS — O99891 Other specified diseases and conditions complicating pregnancy: Secondary | ICD-10-CM

## 2024-06-28 DIAGNOSIS — Z98891 History of uterine scar from previous surgery: Secondary | ICD-10-CM | POA: Diagnosis not present

## 2024-06-28 DIAGNOSIS — Z3480 Encounter for supervision of other normal pregnancy, unspecified trimester: Secondary | ICD-10-CM

## 2024-06-28 DIAGNOSIS — Z8659 Personal history of other mental and behavioral disorders: Secondary | ICD-10-CM

## 2024-06-28 NOTE — Progress Notes (Unsigned)
 PRENATAL VISIT NOTE  Subjective:  Katrina May is a 28 y.o. G4P1021 at [redacted]w[redacted]d being seen today for ongoing prenatal care.  She is currently monitored for the following issues for this high-risk pregnancy and has H/O: C-section; Hx of postpartum depression, currently pregnant; and Supervision of other normal pregnancy, antepartum on their problem list.  Patient reports nausea improved, has not been taking diclegis due to insurance not being active yet.  Contractions: Not present. Vag. Bleeding: None.  Movement: Present. Denies leaking of fluid.   The following portions of the patient's history were reviewed and updated as appropriate: allergies, current medications, past family history, past medical history, past social history, past surgical history and problem list.   Objective:   Vitals:   06/28/24 1627  BP: 123/67  Pulse: (!) 118  Weight: 155 lb (70.3 kg)    Fetal Status:  Fetal Heart Rate (bpm): 156   Movement: Present    General: Alert, oriented and cooperative. Patient is in no acute distress.  Skin: Skin is warm and dry. No rash noted.   Cardiovascular: Normal heart rate noted  Respiratory: Normal respiratory effort, no problems with respiration noted  Abdomen: Soft, gravid, appropriate for gestational age.  Pain/Pressure: Present (Round ligament pain)     Pelvic: Cervical exam deferred        Extremities: Normal range of motion.  Edema: None  Mental Status: Normal mood and affect. Normal behavior. Normal judgment and thought content.      05/31/2024    4:33 PM 07/20/2019    3:36 PM 06/24/2019    2:40 PM  Depression screen PHQ 2/9  Decreased Interest 3 1 0  Down, Depressed, Hopeless 2 2 1   PHQ - 2 Score 5 3 1   Altered sleeping 3 1 2   Tired, decreased energy 3 1 2   Change in appetite 3 2 0  Feeling bad or failure about yourself  1 1 0  Trouble concentrating 2 0 0  Moving slowly or fidgety/restless 2 1 0  Suicidal thoughts 0 1 0  PHQ-9 Score 19  10  5       Data  saved with a previous flowsheet row definition        05/31/2024    4:34 PM 07/20/2019    3:39 PM 06/24/2019    2:40 PM 06/17/2019    2:39 PM  GAD 7 : Generalized Anxiety Score  Nervous, Anxious, on Edge 1 2 1 1   Control/stop worrying 1 1 1 1   Worry too much - different things 1 2 1 1   Trouble relaxing 1 3 1 1   Restless 0 1 0 0  Easily annoyed or irritable 2 1 1 1   Afraid - awful might happen 0 0 0 0  Total GAD 7 Score 6 10 5 5     Assessment and Plan:  Pregnancy: G4P1021 at [redacted]w[redacted]d\  1. Supervision of other normal pregnancy, antepartum (Primary) Nausea improved  2. Flu vaccine need - Flu vaccine trivalent PF, 6mos and older(Flulaval,Afluria,Fluarix,Fluzone)  3. Vaginal odor - Cervicovaginal ancillary only  4. H/O: C-section  5. Hx of postpartum depression, currently pregnant  6. [redacted] weeks gestation of pregnancy   Preterm labor symptoms and general obstetric precautions including but not limited to vaginal bleeding, contractions, leaking of fluid and fetal movement were reviewed in detail with the patient. Please refer to After Visit Summary for other counseling recommendations.   Return in about 1 month (around 07/28/2024) for high OB.  Future Appointments  Date Time Provider  Department Center  07/27/2024  4:15 PM Cleatus Moccasin, MD [facility]  07/29/2024  8:00 AM [facility] PROVIDER 1 [facility]  07/29/2024  8:30 AM [facility] US1 [facility]    Burnard CHRISTELLA Moats, MD

## 2024-06-29 LAB — CERVICOVAGINAL ANCILLARY ONLY
Bacterial Vaginitis (gardnerella): POSITIVE — AB
Candida Glabrata: NEGATIVE
Candida Vaginitis: NEGATIVE
Comment: NEGATIVE
Comment: NEGATIVE
Comment: NEGATIVE

## 2024-06-30 ENCOUNTER — Ambulatory Visit: Payer: Self-pay | Admitting: Obstetrics and Gynecology

## 2024-06-30 MED ORDER — METRONIDAZOLE 500 MG PO TABS: 500.0000 mg | ORAL_TABLET | Freq: Two times a day (BID) | ORAL | 0 refills | Status: AC

## 2024-07-19 ENCOUNTER — Inpatient Hospital Stay (HOSPITAL_COMMUNITY)
Admission: AD | Admit: 2024-07-19 | Discharge: 2024-07-19 | Disposition: A | Discharge status: Home / Self Care | Attending: Obstetrics and Gynecology | Admitting: Obstetrics and Gynecology

## 2024-07-19 DIAGNOSIS — R519 Headache, unspecified: Secondary | ICD-10-CM | POA: Insufficient documentation

## 2024-07-19 DIAGNOSIS — R110 Nausea: Secondary | ICD-10-CM | POA: Insufficient documentation

## 2024-07-19 DIAGNOSIS — Z3A17 17 weeks gestation of pregnancy: Secondary | ICD-10-CM | POA: Insufficient documentation

## 2024-07-19 DIAGNOSIS — O26892 Other specified pregnancy related conditions, second trimester: Secondary | ICD-10-CM | POA: Insufficient documentation

## 2024-07-19 LAB — URINALYSIS, ROUTINE W REFLEX MICROSCOPIC
Bilirubin Urine: NEGATIVE
Glucose, UA: NEGATIVE mg/dL
Hgb urine dipstick: NEGATIVE
Ketones, ur: NEGATIVE mg/dL
Leukocytes,Ua: NEGATIVE
Nitrite: NEGATIVE
Protein, ur: NEGATIVE mg/dL
Specific Gravity, Urine: 1.017 (ref 1.005–1.030)
pH: 6 (ref 5.0–8.0)

## 2024-07-19 MED ORDER — ACETAMINOPHEN-CAFFEINE 500-65 MG PO TABS: 2.0000 | ORAL_TABLET | Freq: Four times a day (QID) | ORAL | 0 refills | Status: AC | PRN

## 2024-07-19 MED ORDER — SCOPOLAMINE 1 MG/3DAYS TD PT72
1.0000 | MEDICATED_PATCH | TRANSDERMAL | Status: DC
  Administered 2024-07-19: 1 mg via TRANSDERMAL
  Filled 2024-07-19: qty 1

## 2024-07-19 MED ORDER — ACETAMINOPHEN-CAFFEINE 500-65 MG PO TABS
2.0000 | ORAL_TABLET | Freq: Once | ORAL | Status: AC
  Administered 2024-07-19: 2 via ORAL
  Filled 2024-07-19: qty 2

## 2024-07-19 MED ORDER — ACETAMINOPHEN-CAFFEINE 500-65 MG PO TABS: 2.0000 | ORAL_TABLET | Freq: Four times a day (QID) | ORAL | 0 refills | Status: DC | PRN

## 2024-07-19 MED ORDER — METOCLOPRAMIDE HCL 10 MG PO TABS: 10.0000 mg | ORAL_TABLET | Freq: Four times a day (QID) | ORAL | 0 refills | Status: DC | PRN

## 2024-07-19 MED ORDER — SCOPOLAMINE 1 MG/3DAYS TD PT72: 1.0000 | MEDICATED_PATCH | TRANSDERMAL | 12 refills | Status: AC

## 2024-07-19 MED ORDER — METOCLOPRAMIDE HCL 10 MG PO TABS: 10.0000 mg | ORAL_TABLET | Freq: Four times a day (QID) | ORAL | 0 refills | Status: AC | PRN

## 2024-07-19 MED ORDER — SCOPOLAMINE 1 MG/3DAYS TD PT72: 1.0000 | MEDICATED_PATCH | TRANSDERMAL | 12 refills | Status: DC

## 2024-07-19 NOTE — MAU Provider Note (Signed)
 S/HPI [name] is a 28 y.o. [phone] patient who presents to MAU today with complaint of reporting she is 17 weeks 6 days.  Patient states she is had a headache on and off for the past 2 weeks and she checked her blood pressure at home which was 146/107.  Patient denies any history of high blood pressure with this pregnancy and denies any history of chronic hypertension.  She states that she has had some nausea and vomiting along with headaches for the past 2 weeks but has been unable to pick up any medication due to financial concerns with her insurance.  She rates her headache pain at 7 out of 10  She denies any OB complaints.  No complaints of vaginal bleeding, leaking of fluid, abdominal cramping, vaginal irritation/burning/itching and no urinary discomfort  [facility]  Her pregnancy is complicated by the following history of a C-section, history of postpartum depression   O BP 119/66 (BP Location: Right Arm)   Pulse 72   Temp 98.5 F (36.9 C) (Oral)   Resp 16   Ht 5' (1.524 m)   Wt 71.6 kg   LMP 03/16/2024   SpO2 98%   BMI 30.84 kg/m  Physical Exam Vitals and nursing note reviewed.  Constitutional:      General: She is not in acute distress.    Appearance: Normal appearance. She is obese. She is not ill-appearing.  HENT:     Head: Normocephalic.     Nose: Nose normal.     Mouth/Throat:     Mouth: Mucous membranes are moist.  Eyes:     Extraocular Movements: Extraocular movements intact.  Cardiovascular:     Rate and Rhythm: Normal rate.  Pulmonary:     Effort: Pulmonary effort is normal.  Abdominal:     Palpations: Abdomen is soft.     Tenderness: There is no abdominal tenderness.  Musculoskeletal:        General: Normal range of motion.     Cervical back: Normal range of motion and neck supple.  Skin:    General: Skin is warm.  Neurological:     Mental Status: She is alert and oriented to person, place, and time.      Motor: No weakness.     Deep Tendon Reflexes: Reflexes normal.  Psychiatric:        Mood and Affect: Mood normal. Mood is not anxious.        Behavior: Behavior normal.   FHR 154  MDM  MODERATE   Available prenatal records reviewed Physical exam performed UA obtained:  NM BP was normotensive with no signs or symptoms of hypertension and benign exam UA normal Excedrin for headache Scopolamine  patch for nausea and headache  Reassessed  patient at 0000 after medication administered  She now reports her pain level is 2 out of 10 for HA  Requested prescriptions to be sent to her pharmacy Will add Reglan as needed for nausea vomiting and/or for headache if unresolved by Excedrin   Orders Placed This Encounter  Procedures   Urinalysis, Routine w reflex microscopic -Urine, Clean Catch    Standing Status:   Standing    Number of Occurrences:   1    Specimen Source:   Urine, Clean Catch [76]   Discharge patient Discharge disposition: 01-Home or Self Care; Discharge patient date: 07/19/2024    Standing Status:   Standing    Number of Occurrences:   1  Discharge disposition:   01-Home or Self Care [1]    Discharge patient date:   07/19/2024      Results for orders placed or performed during the hospital encounter of 07/19/24 (from the past 24 hours)  Urinalysis, Routine w reflex microscopic -Urine, Clean Catch     Status: Abnormal   Collection Time: 07/19/24  9:17 PM  Result Value Ref Range   Color, Urine YELLOW YELLOW   APPearance HAZY (A) CLEAR   Specific Gravity, Urine 1.017 1.005 - 1.030   pH 6.0 5.0 - 8.0   Glucose, UA NEGATIVE NEGATIVE mg/dL   Hgb urine dipstick NEGATIVE NEGATIVE   Bilirubin Urine NEGATIVE NEGATIVE   Ketones, ur NEGATIVE NEGATIVE mg/dL   Protein, ur NEGATIVE NEGATIVE mg/dL   Nitrite NEGATIVE NEGATIVE   Leukocytes,Ua NEGATIVE NEGATIVE     I have reviewed the patient chart and performed the physical exam . I have ordered & interpreted the lab results  and reviewed them with the patient  Medications ordered as stated above/below.  A/P as described below.  Counseling and education provided and patient agreeable  with plan as described below. Verbalized understanding.    ASSESSMENT Medical screening exam complete Chronic nonintractable headache, unspecified headache type  [redacted] weeks gestation of pregnancy  Nausea    PLAN Future Appointments  Date Time Provider Department Center  07/27/2024  4:15 PM Cleatus Moccasin, MD [facility]  07/29/2024  8:00 AM [facility] PROVIDER 1 [facility]  07/29/2024  8:30 AM [facility] US1 [facility]    Discharge from MAU in stable condition  See AVS for full description of educational information and instructions provided to the patient at time of discharge   Safe medications in pregnancy provided to the patient at discharge   Warning signs for worsening condition that would warrant emergency follow-up discussed  Patient may return to MAU as needed  ----------------------------------------------------------------  Olam Dalton, MSN, [facility], [facility]   This chart was dictated using voice recognition software, Dragon. Despite the best efforts of this provider to proofread and correct errors, errors may still occur which can change documentation meaning.

## 2024-07-19 NOTE — MAU Note (Signed)
 Katrina May is a 28 y.o. at [redacted]w[redacted]d here in MAU reporting: my b/p has been really high. Reports headaches off and on x two weeks. B/p at home 146/107.  Denies history of high blood pressure.   Onset of complaint: two weeks Pain score: 7/10 headache Vitals:   07/19/24 2120  BP: 119/66  Pulse: 72  Resp: 16  Temp: 98.5 F (36.9 C)  SpO2: 98%     FHT: 154  Lab orders placed from triage: urine

## 2024-07-19 NOTE — MAU Provider Note (Incomplete)
 S/HPI [name] is a 28 y.o. [phone] patient who presents to MAU today with complaint of reporting she is 17 weeks 6 days.  Patient states she is had a headache on and off for the past 2 weeks and she checked her blood pressure at home which was 146/107.  Patient denies any history of high blood pressure with this pregnancy and denies any history of chronic hypertension.  She states that she has had some nausea and vomiting along with headaches for the past 2 weeks but has been unable to pick up any medication due to financial concerns with her insurance.  She rates her headache pain at 7 out of 10  She denies any OB complaints.  No complaints of vaginal bleeding, leaking of fluid, abdominal cramping, vaginal irritation/burning/itching and no urinary discomfort  [facility]  Her pregnancy is complicated by the following history of a C-section, history of postpartum depression   O BP 119/66 (BP Location: Right Arm)   Pulse 72   Temp 98.5 F (36.9 C) (Oral)   Resp 16   Ht 5' (1.524 m)   Wt 71.6 kg   LMP 03/16/2024   SpO2 98%   BMI 30.84 kg/m  Physical Exam Vitals and nursing note reviewed.  Constitutional:      General: She is not in acute distress.    Appearance: Normal appearance. She is obese. She is not ill-appearing.  HENT:     Head: Normocephalic.     Nose: Nose normal.     Mouth/Throat:     Mouth: Mucous membranes are moist.  Eyes:     Extraocular Movements: Extraocular movements intact.  Cardiovascular:     Rate and Rhythm: Normal rate.  Pulmonary:     Effort: Pulmonary effort is normal.  Abdominal:     Palpations: Abdomen is soft.     Tenderness: There is no abdominal tenderness.  Musculoskeletal:        General: Normal range of motion.     Cervical back: Normal range of motion and neck supple.  Skin:    General: Skin is warm.  Neurological:     Mental Status: She is alert and oriented to person, place, and time.      Motor: No weakness.     Deep Tendon Reflexes: Reflexes normal.  Psychiatric:        Mood and Affect: Mood normal. Mood is not anxious.        Behavior: Behavior normal.    MDM  MODERATE     Orders Placed This Encounter  Procedures  . Urinalysis, Routine w reflex microscopic -Urine, Clean Catch    Standing Status:   Standing    Number of Occurrences:   1    Specimen Source:   Urine, Clean Catch [76]  . Discharge patient Discharge disposition: 01-Home or Self Care; Discharge patient date: 07/19/2024    Standing Status:   Standing    Number of Occurrences:   1    Discharge disposition:   01-Home or Self Care [1]    Discharge patient date:   07/19/2024      Results for orders placed or performed during the hospital encounter of 07/19/24 (from the past 24 hours)  Urinalysis, Routine w reflex microscopic -Urine, Clean Catch     Status: Abnormal   Collection Time: 07/19/24  9:17 PM  Result Value Ref Range   Color, Urine YELLOW YELLOW   APPearance HAZY (A) CLEAR  Specific Gravity, Urine 1.017 1.005 - 1.030   pH 6.0 5.0 - 8.0   Glucose, UA NEGATIVE NEGATIVE mg/dL   Hgb urine dipstick NEGATIVE NEGATIVE   Bilirubin Urine NEGATIVE NEGATIVE   Ketones, ur NEGATIVE NEGATIVE mg/dL   Protein, ur NEGATIVE NEGATIVE mg/dL   Nitrite NEGATIVE NEGATIVE   Leukocytes,Ua NEGATIVE NEGATIVE       I have reviewed the patient chart and performed the physical exam . I have ordered & interpreted the lab results and reviewed and interpreted the *** Medications ordered as stated below.  A/P as described below.  Counseling and education provided and patient agreeable  with plan as described below. Verbalized understanding.    ASSESSMENT Medical screening exam complete     PLAN Future Appointments  Date Time Provider Department Center  07/27/2024  4:15 PM Cleatus Moccasin, MD [facility]  07/29/2024  8:00 AM [facility] PROVIDER 1 [facility]  07/29/2024  8:30 AM [facility] US1 [facility]     Discharge from MAU in stable condition See AVS for full description of educational information and instructions provided to the patient at time of discharge List of options for follow-up given *** Warning signs for worsening condition that would warrant emergency follow-up discussed Patient may return to MAU as needed   Littie Olam LABOR, NP 07/19/2024 11:54 PM   This chart was dictated using voice recognition software, Dragon. Despite the best efforts of this provider to proofread and correct errors, errors may still occur which can change documentation meaning.

## 2024-07-27 ENCOUNTER — Encounter: Payer: Self-pay | Admitting: Obstetrics and Gynecology

## 2024-07-27 ENCOUNTER — Other Ambulatory Visit: Payer: Self-pay

## 2024-07-27 ENCOUNTER — Ambulatory Visit: Admitting: Obstetrics and Gynecology

## 2024-07-27 VITALS — BP 105/71 | HR 98 | Wt 155.5 lb

## 2024-07-27 DIAGNOSIS — Z8659 Personal history of other mental and behavioral disorders: Secondary | ICD-10-CM | POA: Diagnosis not present

## 2024-07-27 DIAGNOSIS — Z98891 History of uterine scar from previous surgery: Secondary | ICD-10-CM | POA: Diagnosis not present

## 2024-07-27 DIAGNOSIS — Z3480 Encounter for supervision of other normal pregnancy, unspecified trimester: Secondary | ICD-10-CM

## 2024-07-27 DIAGNOSIS — F32A Depression, unspecified: Secondary | ICD-10-CM | POA: Insufficient documentation

## 2024-07-27 DIAGNOSIS — R7689 Other specified abnormal immunological findings in serum: Secondary | ICD-10-CM | POA: Diagnosis not present

## 2024-07-27 DIAGNOSIS — Z3A19 19 weeks gestation of pregnancy: Secondary | ICD-10-CM | POA: Diagnosis not present

## 2024-07-27 DIAGNOSIS — A530 Latent syphilis, unspecified as early or late: Secondary | ICD-10-CM | POA: Insufficient documentation

## 2024-07-27 DIAGNOSIS — O99891 Other specified diseases and conditions complicating pregnancy: Secondary | ICD-10-CM | POA: Diagnosis not present

## 2024-07-27 NOTE — Progress Notes (Signed)
 PRENATAL VISIT NOTE  Subjective:  Katrina May is a 28 y.o. G4P1021 at [redacted]w[redacted]d being seen today for ongoing prenatal care.  She is currently monitored for the following issues for this low-risk pregnancy and has H/O: C-section; Hx of postpartum depression, currently pregnant; Supervision of other normal pregnancy, antepartum; RPR positive; Red blood cell antibody positive; and Depression on their problem list.  Patient reports no complaints.  Contractions: Not present. Vag. Bleeding: None.  Movement: Present. Denies leaking of fluid.   The following portions of the patient's history were reviewed and updated as appropriate: allergies, current medications, past family history, past medical history, past social history, past surgical history and problem list.   Objective:   Vitals:   07/27/24 1621  BP: 105/71  Pulse: 98  Weight: 155 lb 8 oz (70.5 kg)    Fetal Status:  Fetal Heart Rate (bpm): 159   Movement: Present    General: Alert, oriented and cooperative. Patient is in no acute distress.  Skin: Skin is warm and dry. No rash noted.   Cardiovascular: Normal heart rate noted  Respiratory: Normal respiratory effort, no problems with respiration noted  Abdomen: Soft, gravid, appropriate for gestational age.  Pain/Pressure: Absent     Pelvic: Cervical exam deferred        Extremities: Normal range of motion.  Edema: None  Mental Status: Normal mood and affect. Normal behavior. Normal judgment and thought content.      05/31/2024    4:33 PM 07/20/2019    3:36 PM 06/24/2019    2:40 PM  Depression screen PHQ 2/9  Decreased Interest 3 1 0  Down, Depressed, Hopeless 2 2 1   PHQ - 2 Score 5 3 1   Altered sleeping 3 1 2   Tired, decreased energy 3 1 2   Change in appetite 3 2 0  Feeling bad or failure about yourself  1 1 0  Trouble concentrating 2 0 0  Moving slowly or fidgety/restless 2 1 0  Suicidal thoughts 0 1 0  PHQ-9 Score 19  10  5       Data saved with a previous flowsheet row  definition        05/31/2024    4:34 PM 07/20/2019    3:39 PM 06/24/2019    2:40 PM 06/17/2019    2:39 PM  GAD 7 : Generalized Anxiety Score  Nervous, Anxious, on Edge 1 2 1 1   Control/stop worrying 1 1 1 1   Worry too much - different things 1 2 1 1   Trouble relaxing 1 3 1 1   Restless 0 1 0 0  Easily annoyed or irritable 2 1 1 1   Afraid - awful might happen 0 0 0 0  Total GAD 7 Score 6 10 5 5     Assessment and Plan:  Pregnancy: G4P1021 at [redacted]w[redacted]d 1. Supervision of other normal pregnancy, antepartum (Primary) MSAFP today.  12/11 is anatomy US .  We discussed LARC options today with a focus on the IUDs.   2. Pregnancy with 19 completed weeks gestation  3. H/O: C-section Desires TOLAC. Prior c-section due to prolonged decel/fetal brady x10 minutes and had emergency c-section. Occurred after epidural.   4. Red blood cell antibody positive Recheck antibody screen today.   5. Depression, unspecified depression type PHQ9 in October was 19. This is due to when she got pregnant it was not planned and upsetting for her because she had been working really hard to get her weight and body where she had wanted it.  She knew she wanted more children, but had not planned this yet. So getting pregnant symbolized weight gain again and it was rough. Plus her family all lives in [place] area and sometimes it is lonely being away from her family. She was prescribed zoloft  after delivery during when she had some of these symptoms but has not taken it recently.    Preterm labor symptoms and general obstetric precautions including but not limited to vaginal bleeding, contractions, leaking of fluid and fetal movement were reviewed in detail with the patient. Please refer to After Visit Summary for other counseling recommendations.   Return in about 4 weeks (around 08/24/2024) for OB VISIT, MD or APP.  Future Appointments  Date Time Provider Department Center  07/29/2024  8:00 AM [facility] PROVIDER 1 [facility]  [facility]  07/29/2024  8:30 AM [facility] US1 [facility]    Vina Solian, MD

## 2024-07-29 ENCOUNTER — Ambulatory Visit: Attending: Obstetrics and Gynecology

## 2024-07-29 ENCOUNTER — Other Ambulatory Visit: Payer: Self-pay | Admitting: Obstetrics and Gynecology

## 2024-07-29 ENCOUNTER — Ambulatory Visit: Admitting: Maternal & Fetal Medicine

## 2024-07-29 ENCOUNTER — Encounter: Payer: Self-pay | Admitting: Maternal & Fetal Medicine

## 2024-07-29 VITALS — BP 121/56 | HR 71

## 2024-07-29 DIAGNOSIS — O43192 Other malformation of placenta, second trimester: Secondary | ICD-10-CM | POA: Insufficient documentation

## 2024-07-29 DIAGNOSIS — Z3490 Encounter for supervision of normal pregnancy, unspecified, unspecified trimester: Secondary | ICD-10-CM

## 2024-07-29 DIAGNOSIS — Z3480 Encounter for supervision of other normal pregnancy, unspecified trimester: Secondary | ICD-10-CM | POA: Insufficient documentation

## 2024-07-29 DIAGNOSIS — Z8659 Personal history of other mental and behavioral disorders: Secondary | ICD-10-CM

## 2024-07-29 LAB — AFP, SERUM, OPEN SPINA BIFIDA
AFP MoM: 1.12
AFP Value: 53.2 ng/mL
Gest. Age on Collection Date: 19 wk
Maternal Age At EDD: 26.5 a
OSBR Risk 1 IN: 8371
Test Results:: NEGATIVE
Weight: 156 [lb_av]

## 2024-07-29 LAB — AB SCR+ANTIBODY ID: Antibody Screen: POSITIVE — AB

## 2024-07-29 LAB — ANTIBODY SCREEN

## 2024-07-29 NOTE — Progress Notes (Signed)
 Patient information  Patient Name: Katrina May  Patient MRN:   [id number]  Referring practice: MFM Referring Provider: [facility])  Problem List   Patient Active Problem List   Diagnosis Date Noted   Marginal insertion of umbilical cord affecting management of mother in second trimester 07/29/2024   RPR positive 07/27/2024   Red blood cell antibody positive 07/27/2024   Depression 07/27/2024   Supervision of other normal pregnancy, antepartum 05/31/2024   Hx of postpartum depression, currently pregnant 05/27/2024   H/O: C-section 04/21/2020   Maternal Fetal Medicine Consult Katrina May is a 28 y.o. [id number] at [redacted]w[redacted]d here for ultrasound and consultation. She had low risk aneuploidy screening of a female fetus. Carrier screening was Negative for the basic screening (SMA, alpha-thal, beta-thal, and cystic fibroisis. Maternal serum AFP n/a. She has no acute concerns.   Patient is here for an anatomy ultrasound, which demonstrates normal fetal anatomy. A marginal umbilical cord insertion is present, and counseling was provided regarding its association with an increased risk of fetal growth restriction. Growth is normal today. Growth ultrasounds will be needed in the third trimester every 4-6 weeks until delivery. Antenatal testing is not indicated unless estimated fetal weight falls below the 10th percentile, abdominal circumference is low, or other clinical indications arise. The patient was reassured that this finding is unlikely to significantly impact the pregnancy and does not alter mode of delivery, though gentle cord traction should be used during placental delivery to reduce the risk of umbilical cord avulsion.  Recommendations: -Growth ultrasounds every 4-6 weeks in the third trimester -Antenatal testing only if growth <10th percentile or other indications arise -Routine prenatal care -Fetal movement precautions -Gentle cord traction during placental  delivery  Review of Systems: A review of systems was performed and was negative except per HPI   Past Obstetrical History:  OB History  Gravida Para Term Preterm AB Living  4 1 1  0 2 1  SAB IAB Ectopic Multiple Live Births  2 0 0 0 1    # Outcome Date GA Lbr Len/2nd Weight Sex Type Anes PTL Lv  4 Current           3 SAB 2021 [redacted]w[redacted]d         2 Term 06/27/19 [redacted]w[redacted]d  7 lb 4.8 oz (3.31 kg) M CS-LTranv EPI  LIV  1 SAB 2017 [redacted]w[redacted]d            Past Medical History:  Past Medical History:  Diagnosis Date   Anxiety    Depression    S/P emergency cesarean section 06/27/2019   Supervision of low-risk pregnancy 12/08/2018   BABYSCRIPTS PATIENT: [ ] Initial [ ] 12 [ ] 20 [ ] 28 [x] 32 [x] 36 [ ] 38 [ ] 39 [ ] 40          Nursing Staff    Provider      Office Location     [facility]    Dating     LMP      Language     English    Anatomy US      normal      Flu Vaccine     06/02/2019    Genetic Screen     NIPS:  Low risk female         TDaP vaccine      04/06/2019    Hgb A1C or   GTT    Early   Third trimester 69/137/107      Rhogam  Past Surgical History:    Past Surgical History:  Procedure Laterality Date   CESAREAN SECTION N/A 06/27/2019   Procedure: CESAREAN SECTION;  Surgeon: Herchel Gloris LABOR, MD;  Location: [facility] LD ORS;  Service: Obstetrics;  Laterality: N/A;     Home Medications:   Medications Ordered Prior to Encounter[1]    Allergies:   Allergies[2]   Physical Exam:   Vitals:   07/29/24 0808  BP: (!) 121/56  Pulse: 71   Sitting comfortably on the sonogram table Nonlabored breathing Normal rate and rhythm Abdomen is nontender  Thank you for the opportunity to be involved with this patient's care. Please let us  know if we can be of any further assistance.   Delora Smaller MFM, [place]   07/29/2024  10:42 AM      [1]  Current Outpatient Medications on File Prior to Visit  Medication Sig Dispense Refill   acetaminophen -caffeine  (EXCEDRIN TENSION HEADACHE) 500-65 MG TABS per  tablet Take 2 tablets by mouth 4 (four) times daily as needed (Foe headaches). 60 tablet 0   metoCLOPramide  (REGLAN ) 10 MG tablet Take 1 tablet (10 mg total) by mouth every 6 (six) hours as needed for nausea. 30 tablet 0   Prenatal Vit-Fe Fumarate-FA (PRENATAL MULTIVITAMIN) TABS tablet Take 1 tablet by mouth daily with lunch.      promethazine  (PHENERGAN ) 25 MG tablet Take 1 tablet (25 mg total) by mouth every 6 (six) hours as needed for nausea or vomiting. 30 tablet 0   scopolamine  (TRANSDERM-SCOP) 1 MG/3DAYS Place 1 patch (1 mg total) onto the skin every 3 (three) days. 10 patch 12   No current facility-administered medications on file prior to visit.  [2] No Known Allergies

## 2024-08-02 ENCOUNTER — Ambulatory Visit: Payer: Self-pay | Admitting: Obstetrics and Gynecology

## 2024-08-27 ENCOUNTER — Ambulatory Visit: Admitting: Certified Nurse Midwife

## 2024-08-27 ENCOUNTER — Other Ambulatory Visit: Payer: Self-pay

## 2024-08-27 VITALS — BP 116/77 | HR 80 | Wt 158.6 lb

## 2024-08-27 DIAGNOSIS — R7689 Other specified abnormal immunological findings in serum: Secondary | ICD-10-CM

## 2024-08-27 DIAGNOSIS — O43192 Other malformation of placenta, second trimester: Secondary | ICD-10-CM | POA: Diagnosis not present

## 2024-08-27 DIAGNOSIS — O0992 Supervision of high risk pregnancy, unspecified, second trimester: Secondary | ICD-10-CM | POA: Diagnosis not present

## 2024-08-27 DIAGNOSIS — Z3A23 23 weeks gestation of pregnancy: Secondary | ICD-10-CM

## 2024-08-27 NOTE — Progress Notes (Signed)
 "  PRENATAL VISIT NOTE  Subjective:  Katrina May is a 29 y.o. G4P1021 at [redacted]w[redacted]d being seen today for ongoing prenatal care.  She is currently monitored for the following issues for this high-risk pregnancy and has H/O: C-section; Hx of postpartum depression, currently pregnant; Supervision of high-risk pregnancy; RPR positive; Red blood cell antibody positive; Depression; and Marginal insertion of umbilical cord affecting management of mother in second trimester on their problem list.  Patient reports no complaints.  Contractions: Not present. Vag. Bleeding: None.  Movement: Present. Denies leaking of fluid.   The following portions of the patient's history were reviewed and updated as appropriate: allergies, current medications, past family history, past medical history, past social history, past surgical history and problem list.   Objective:   Vitals:   08/27/24 1123  BP: 116/77  Pulse: 80  Weight: 158 lb 9.6 oz (71.9 kg)   Fetal Status:  Fetal Heart Rate (bpm): 151 Fundal Height: 23 cm Movement: Present    General: Alert, oriented and cooperative. Patient is in no acute distress.  Skin: Skin is warm and dry. No rash noted.   Cardiovascular: Normal heart rate noted  Respiratory: Normal respiratory effort, no problems with respiration noted  Abdomen: Soft, gravid, appropriate for gestational age.  Pain/Pressure: Absent     Pelvic: Cervical exam deferred        Extremities: Normal range of motion.  Edema: Trace (feet)  Mental Status: Normal mood and affect. Normal behavior. Normal judgment and thought content.      05/31/2024    4:33 PM 07/20/2019    3:36 PM 06/24/2019    2:40 PM  Depression screen PHQ 2/9  Decreased Interest 3 1 0  Down, Depressed, Hopeless 2 2 1   PHQ - 2 Score 5 3 1   Altered sleeping 3 1 2   Tired, decreased energy 3 1 2   Change in appetite 3 2 0  Feeling bad or failure about yourself  1 1 0  Trouble concentrating 2 0 0  Moving slowly or fidgety/restless 2  1 0  Suicidal thoughts 0 1 0  PHQ-9 Score 19  10  5       Data saved with a previous flowsheet row definition       05/31/2024    4:34 PM 07/20/2019    3:39 PM 06/24/2019    2:40 PM 06/17/2019    2:39 PM  GAD 7 : Generalized Anxiety Score  Nervous, Anxious, on Edge 1 2 1 1   Control/stop worrying 1 1 1 1   Worry too much - different things 1 2 1 1   Trouble relaxing 1 3 1 1   Restless 0 1 0 0  Easily annoyed or irritable 2 1 1 1   Afraid - awful might happen 0 0 0 0  Total GAD 7 Score 6 10 5 5    Assessment and Plan:  Pregnancy: G4P1021 at [redacted]w[redacted]d 1. Supervision of high risk pregnancy in second trimester (Primary) - Doing well, feeling regular and vigorous fetal movement   2. [redacted] weeks gestation of pregnancy - Routine OB care including anticipatory guidance re GTT at next visit  3. Red blood cell antibody positive - Will redraw antibody screen with 3rd trimester labs  4. Marginal insertion of umbilical cord affecting management of mother in second trimester - Being followed by MFM for antibody positive status and MCI  Preterm labor symptoms and general obstetric precautions including but not limited to vaginal bleeding, contractions, leaking of fluid and fetal movement were reviewed in  detail with the patient. Please refer to After Visit Summary for other counseling recommendations.   Return in about 3 weeks (around 09/17/2024) for IN-PERSON, HOB/GTT.  Future Appointments  Date Time Provider Department Center  09/16/2024  8:55 AM Regino Camie DELENA EDDY [facility]  09/16/2024  9:20 AM [facility] LAB [facility]  09/30/2024  9:15 AM [facility] PROVIDER 1 [facility]  09/30/2024  9:30 AM [facility] US1 [facility]   Cornell JONELLE Finder, CNM "

## 2024-09-15 NOTE — Progress Notes (Unsigned)
 "  PRENATAL VISIT NOTE  Subjective:  Katrina May is a 29 y.o. G4P1021 at [redacted]w[redacted]d being seen today for ongoing prenatal care.  She is currently monitored for the following issues for this high-risk pregnancy and has H/O: C-section; Hx of postpartum depression, currently pregnant; Supervision of high-risk pregnancy; RPR positive; Red blood cell antibody positive; Depression; and Marginal insertion of umbilical cord affecting management of mother in second trimester on their problem list.  Patient reports {[id number]}.   .  .   . Denies leaking of fluid.   The following portions of the patient's history were reviewed and updated as appropriate: allergies, current medications, past family history, past medical history, past social history, past surgical history and problem list.   Objective:   There were no vitals filed for this visit.  Fetal Status:           General: Alert, oriented and cooperative. Patient is in no acute distress.  Skin: Skin is warm and dry. No rash noted.   Cardiovascular: Normal heart rate noted  Respiratory: Normal respiratory effort, no problems with respiration noted  Abdomen: Soft, gravid, appropriate for gestational age.        Pelvic: Cervical exam deferred        Extremities: Normal range of motion.     Mental Status: Normal mood and affect. Normal behavior. Normal judgment and thought content.      05/31/2024    4:33 PM 07/20/2019    3:36 PM 06/24/2019    2:40 PM  Depression screen PHQ 2/9  Decreased Interest 3 1 0  Down, Depressed, Hopeless 2 2 1   PHQ - 2 Score 5 3 1   Altered sleeping 3 1 2   Tired, decreased energy 3 1 2   Change in appetite 3 2 0  Feeling bad or failure about yourself  1 1 0  Trouble concentrating 2 0 0  Moving slowly or fidgety/restless 2 1 0  Suicidal thoughts 0 1 0  PHQ-9 Score 19  10  5       Data saved with a previous flowsheet row definition        05/31/2024    4:34 PM 07/20/2019    3:39 PM 06/24/2019    2:40 PM 06/17/2019     2:39 PM  GAD 7 : Generalized Anxiety Score  Nervous, Anxious, on Edge 1  2  1  1    Control/stop worrying 1  1  1  1    Worry too much - different things 1  2  1  1    Trouble relaxing 1  3  1  1    Restless 0  1  0  0   Easily annoyed or irritable 2  1  1  1    Afraid - awful might happen 0  0  0  0   Total GAD 7 Score 6 10 5 5      Data saved with a previous flowsheet row definition    Assessment and Plan:  Pregnancy: G4P1021 at [redacted]w[redacted]d 1. Supervision of high risk pregnancy in second trimester (Primary) - Doing well, feeling regular and vigorous fetal movement  2. [redacted] weeks gestation of pregnancy - Routine PNC, anticipatory guidance.  - 3T labs today with addition of antibody screen.   3. Red blood cell antibody positive - Antibody screen today.   4. Marginal insertion of umbilical cord affecting management of mother in second trimester ***  5. H/O: C-section - Desires TOLAC  Preterm labor symptoms and general obstetric precautions  including but not limited to vaginal bleeding, contractions, leaking of fluid and fetal movement were reviewed in detail with the patient. Please refer to After Visit Summary for other counseling recommendations.   No follow-ups on file.  Future Appointments  Date Time Provider Department Center  09/16/2024  8:55 AM Regino Camie DELENA EDDY [facility]  09/16/2024  9:20 AM [facility]  09/30/2024  9:15 AM [facility] PROVIDER 1 [facility]  09/30/2024  9:30 AM [facility] US1 [facility]    Camie DELENA Regino, CNM  "

## 2024-09-15 NOTE — Patient Instructions (Signed)
 Labor after Cesarean is a safe alternative to repeat (scheduled) cesarean birth for most people after 1 or 2 cesarean births.  - The success rate for VBAC (vaginal birth after cesarean) is approximately 76% after 1 cesarean and 71%-82% after 2 cesareans. - The risk of uterine rupture is approximately 0.47%-0.7% after 1 cesarean and 1.36% after 2 cesareans. - Generally similar outcomes for baby  Contraindications: - Previous classical incision (more likely if your cesarean birth was preterm) - Previous other surgeries on your uterus  - Conditions in which vaginal birth is otherwise not indicated  Success: You can increase your chance of success by going into labor naturally (not being induced). The best way is by helping to make sure your cervix is "ripe" or ready for labor.  - Eating 6-8 dates per day starting as early as 34 weeks can help soften and even dilate your cervix. You may eat them plain, in smoothies, or in energy balls.  They do contain sugar so eat use good judgment if you have diabetes and balance with protein.  - Sex can also help to ripen the cervix.  - The [employer] is a series of exercises to help get your baby into a good position.  - Cleaning the house on hands and knees - another good method to get baby into a good position - We can consider cervical sweeps to help ripen the cervix - 38-39 weeks.   More information:  Evidence Based Birth TOLAC Podcast and Article: [email or web address].choi-stevens.org/ [employer] LAC Decision Tool: https://www.[facility].ca/sites/default/files/2021-06/Deciding-how-to-give-birth-after-[email or web address]-English.pdf

## 2024-09-16 ENCOUNTER — Other Ambulatory Visit: Payer: Self-pay

## 2024-09-16 ENCOUNTER — Ambulatory Visit (INDEPENDENT_AMBULATORY_CARE_PROVIDER_SITE_OTHER): Payer: Self-pay | Admitting: Certified Nurse Midwife

## 2024-09-16 VITALS — BP 107/61 | HR 63 | Wt 161.6 lb

## 2024-09-16 DIAGNOSIS — O0992 Supervision of high risk pregnancy, unspecified, second trimester: Secondary | ICD-10-CM

## 2024-09-16 DIAGNOSIS — Z3A26 26 weeks gestation of pregnancy: Secondary | ICD-10-CM | POA: Diagnosis not present

## 2024-09-16 DIAGNOSIS — Z98891 History of uterine scar from previous surgery: Secondary | ICD-10-CM

## 2024-09-16 DIAGNOSIS — O43192 Other malformation of placenta, second trimester: Secondary | ICD-10-CM

## 2024-09-16 DIAGNOSIS — R7689 Other specified abnormal immunological findings in serum: Secondary | ICD-10-CM

## 2024-09-16 MED ORDER — EXCEDRIN TENSION HEADACHE 500-65 MG PO TABS: 2.0000 | ORAL_TABLET | Freq: Three times a day (TID) | ORAL | 2 refills | Status: AC | PRN

## 2024-09-16 MED ORDER — METOCLOPRAMIDE HCL 10 MG PO TABS: 10.0000 mg | ORAL_TABLET | Freq: Four times a day (QID) | ORAL | 0 refills | Status: AC

## 2024-09-17 LAB — CBC
Hematocrit: 32.5 % — ABNORMAL LOW (ref 34.0–46.6)
Hemoglobin: 11 g/dL — ABNORMAL LOW (ref 11.1–15.9)
MCH: 31.2 pg (ref 26.6–33.0)
MCHC: 33.8 g/dL (ref 31.5–35.7)
MCV: 92 fL (ref 79–97)
Platelets: 168 10*3/uL (ref 150–450)
RBC: 3.53 x10E6/uL — ABNORMAL LOW (ref 3.77–5.28)
RDW: 12.7 % (ref 11.7–15.4)
WBC: 8.1 10*3/uL (ref 3.4–10.8)

## 2024-09-17 LAB — HIV ANTIBODY (ROUTINE TESTING W REFLEX): HIV Screen 4th Generation wRfx: NONREACTIVE

## 2024-09-17 LAB — SYPHILIS: RPR W/REFLEX TO RPR TITER AND TREPONEMAL ANTIBODIES, TRADITIONAL SCREENING AND DIAGNOSIS ALGORITHM: RPR Ser Ql: REACTIVE — AB

## 2024-09-17 LAB — GLUCOSE TOLERANCE, 2 HOURS W/ 1HR
Glucose, 1 hour: 148 mg/dL (ref 70–179)
Glucose, 2 hour: 110 mg/dL (ref 70–152)
Glucose, Fasting: 71 mg/dL (ref 70–91)

## 2024-09-17 LAB — RPR, QUANT+TP ABS (REFLEX)
Rapid Plasma Reagin, Quant: 1:2 {titer} — ABNORMAL HIGH
T Pallidum Abs: NONREACTIVE

## 2024-09-21 LAB — ANTIBODY SCREEN

## 2024-09-21 LAB — ANTIBODY IDENTIFICATION

## 2024-09-21 LAB — AB SCR+ANTIBODY ID: Antibody Screen: POSITIVE — AB

## 2024-09-30 ENCOUNTER — Other Ambulatory Visit

## 2024-09-30 ENCOUNTER — Ambulatory Visit

## 2024-09-30 DIAGNOSIS — O0992 Supervision of high risk pregnancy, unspecified, second trimester: Secondary | ICD-10-CM

## 2024-10-14 ENCOUNTER — Encounter: Payer: Self-pay | Admitting: Obstetrics & Gynecology
